# Patient Record
Sex: Female | Born: 1950 | Race: Black or African American | Hispanic: No | Marital: Married | Smoking: Former smoker
Health system: Southern US, Community
[De-identification: ages and names within clinical notes are randomized; demographics above are authoritative.]

## PROBLEM LIST (undated history)

## (undated) DIAGNOSIS — J45909 Unspecified asthma, uncomplicated: Secondary | ICD-10-CM

## (undated) DIAGNOSIS — J069 Acute upper respiratory infection, unspecified: Secondary | ICD-10-CM

## (undated) DIAGNOSIS — E232 Diabetes insipidus: Secondary | ICD-10-CM

## (undated) DIAGNOSIS — L309 Dermatitis, unspecified: Secondary | ICD-10-CM

## (undated) DIAGNOSIS — E7800 Pure hypercholesterolemia, unspecified: Secondary | ICD-10-CM

## (undated) DIAGNOSIS — I8290 Acute embolism and thrombosis of unspecified vein: Secondary | ICD-10-CM

## (undated) HISTORY — DX: Unspecified asthma, uncomplicated: J45.909

## (undated) HISTORY — DX: Diabetes insipidus: E23.2

## (undated) HISTORY — DX: Acute upper respiratory infection, unspecified: J06.9

## (undated) HISTORY — DX: Pure hypercholesterolemia, unspecified: E78.00

## (undated) HISTORY — DX: Dermatitis, unspecified: L30.9

## (undated) HISTORY — DX: Acute embolism and thrombosis of unspecified vein: I82.90

## (undated) HISTORY — PX: ANKLE SURGERY: SHX546

---

## 1988-02-09 HISTORY — PX: TOTAL ABDOMINAL HYSTERECTOMY: SHX209

## 1998-04-30 ENCOUNTER — Encounter: Payer: Self-pay | Admitting: Cardiology

## 1998-04-30 ENCOUNTER — Ambulatory Visit (HOSPITAL_COMMUNITY): Admission: RE | Admit: 1998-04-30 | Discharge: 1998-04-30 | Payer: Self-pay | Admitting: Cardiology

## 1998-12-16 ENCOUNTER — Ambulatory Visit (HOSPITAL_BASED_OUTPATIENT_CLINIC_OR_DEPARTMENT_OTHER): Admission: RE | Admit: 1998-12-16 | Discharge: 1998-12-16 | Payer: Self-pay | Admitting: Orthopedic Surgery

## 1999-02-13 ENCOUNTER — Ambulatory Visit: Admission: RE | Admit: 1999-02-13 | Discharge: 1999-02-13 | Payer: Self-pay | Admitting: Family Medicine

## 2000-11-25 ENCOUNTER — Ambulatory Visit (HOSPITAL_COMMUNITY): Admission: RE | Admit: 2000-11-25 | Discharge: 2000-11-25 | Payer: Self-pay | Admitting: Family Medicine

## 2001-01-26 ENCOUNTER — Ambulatory Visit (HOSPITAL_COMMUNITY): Admission: RE | Admit: 2001-01-26 | Discharge: 2001-01-26 | Payer: Self-pay | Admitting: Family Medicine

## 2001-12-07 ENCOUNTER — Encounter: Admission: RE | Admit: 2001-12-07 | Discharge: 2001-12-07 | Payer: Self-pay | Admitting: Family Medicine

## 2001-12-07 ENCOUNTER — Encounter: Payer: Self-pay | Admitting: Family Medicine

## 2002-10-28 ENCOUNTER — Encounter: Payer: Self-pay | Admitting: Orthopedic Surgery

## 2002-10-28 ENCOUNTER — Encounter: Admission: RE | Admit: 2002-10-28 | Discharge: 2002-10-28 | Payer: Self-pay | Admitting: Orthopedic Surgery

## 2003-03-29 ENCOUNTER — Encounter: Admission: RE | Admit: 2003-03-29 | Discharge: 2003-03-29 | Payer: Self-pay | Admitting: Family Medicine

## 2005-02-03 ENCOUNTER — Encounter: Admission: RE | Admit: 2005-02-03 | Discharge: 2005-02-03 | Payer: Self-pay | Admitting: Family Medicine

## 2005-11-05 ENCOUNTER — Encounter: Admission: RE | Admit: 2005-11-05 | Discharge: 2005-11-05 | Payer: Self-pay | Admitting: Orthopedic Surgery

## 2010-10-26 ENCOUNTER — Encounter: Payer: Self-pay | Admitting: Internal Medicine

## 2010-10-26 ENCOUNTER — Ambulatory Visit (INDEPENDENT_AMBULATORY_CARE_PROVIDER_SITE_OTHER): Payer: 59 | Admitting: Internal Medicine

## 2010-10-26 VITALS — BP 122/78 | HR 84 | Temp 97.8°F | Ht 61.0 in | Wt 150.4 lb

## 2010-10-26 DIAGNOSIS — R05 Cough: Secondary | ICD-10-CM

## 2010-10-26 NOTE — Patient Instructions (Signed)
Cough is from post viral reactive cough,  sinus drainage, possible asthma, acid reflux and all conspiring with each other  #Sinus drainage  - start netti pot daily; take picture of it - start nasal steroid daily - take samples #Possible Acid Reflux  - take acid reflux pill daily on empty stomach   - take diet sheet from [place] - avoid colas, spices, cheeses, spirits, red meats, beer, chocolates, fried foods etc.,   - sleep with head end of bed elevated  - eat small frequent meals  - do not go to bed for 3 hours after last meal #Asthma   - start QVAR 61mg 2 puff twice daily - take samples and show inhaler technique  #Cycle of cough  -  please choose 2-3 days and observe complete voice rest - no talking or whispering  - At any time there (not just the 3 days of voice rest but anytime) there is urge to cough, drink water or swallow or sip on throat lozenge   #Followup - I will see you for followup in in 5-6 weeks.

## 2010-10-26 NOTE — Progress Notes (Signed)
Subjective:    Patient ID: Madison Ball, female    DOB: 01/09/1951, 60 y.o.   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 As an adult has periodic bronchitis.  She has to talk a lot at work. C/o cough x 3 months. Insidious onset. Prior to onset of cough had bronchitis and was given antibiotics. This was followed by chest congestion and cough. After that congestion resolved but cough got worse.  Cough is dry. Quality is described as 'spasms'. Points to center of chest as source of cough.  Associated chest tightness + in "bronchial tree" and some dyspnea with exertion that is variable present. Associated feeling of mucus in throat and constant need to clear it. Up and down course; sometimes gets better and then gets worse. 10 day trial of symbicort x 3 weeks ago helped partially. States PMD noticed wheeze but she does not hear any wheeze. Overall, cough is better since onset. Cough worsened by deep inspiration and talking spells. Cough interferes with abiliuty to work   [employer] RSI score for LPR cough is 23 and c/w LPR cough - moderate hoarseness, annoying cough. Moderate-severe clearing of throat, feeling of mucus, and sensation of lump in throat. Mild heartburn and cough when lying down  PMHX -  1. has GERD but well controlled with nexium x few years but not on correct diet 2. Seasonal allergies with post nasal drip - currently active, takes daily sudafed but not nasal steroids 3. No ACE inhibitor intake 4. Asthma as childhood   Review of Systems  Constitutional: Negative for fever and unexpected weight change.  HENT: Positive for sore throat. Negative for ear pain, nosebleeds, congestion, rhinorrhea, sneezing, trouble swallowing, dental problem, postnasal drip and sinus pressure.   Eyes: Negative for redness and itching.  Respiratory: Positive for cough, chest tightness and shortness of breath. Negative for wheezing.     Cardiovascular: Positive for leg swelling. Negative for palpitations.  Gastrointestinal: Negative for nausea and vomiting.  Genitourinary: Negative for dysuria.  Musculoskeletal: Positive for joint swelling.  Skin: Negative for rash.  Neurological: Positive for headaches.  Hematological: Does not bruise/bleed easily.  Psychiatric/Behavioral: Negative for dysphoric mood. The patient is not nervous/anxious.        Objective:   Physical Exam  Vitals reviewed. Constitutional: She is oriented to person, place, and time. She appears well-developed and well-nourished. No distress.       overweight  HENT:  Head: Normocephalic and atraumatic.  Right Ear: External ear normal.  Left Ear: External ear normal.  Mouth/Throat: Oropharynx is clear and moist. No oropharyngeal exudate.  Eyes: Conjunctivae and EOM are normal. Pupils are equal, round, and reactive to light. Right eye exhibits no discharge. Left eye exhibits no discharge. No scleral icterus.  Neck: Normal range of motion. Neck supple. No JVD present. No tracheal deviation present. No thyromegaly present.  Cardiovascular: Normal rate, regular rhythm, normal heart sounds and intact distal pulses.  Exam reveals no gallop and no friction rub.   No murmur heard. Pulmonary/Chest: Effort normal and breath sounds normal. No respiratory distress. She has no wheezes. She has no rales. She exhibits no tenderness.  Abdominal: Soft. Bowel sounds are normal. She exhibits no distension and no mass. There is no tenderness. There is no rebound and no guarding.  Musculoskeletal: Normal range of motion. She exhibits no edema and no tenderness.  Lymphadenopathy:    She has no cervical adenopathy.  Neurological: She is  alert and oriented to person, place, and time. She has normal reflexes. No cranial nerve deficit. She exhibits normal muscle tone. Coordination normal.  Skin: Skin is warm and dry. No rash noted. She is not diaphoretic. No erythema. No pallor.   Psychiatric: She has a normal mood and affect. Her behavior is normal. Judgment and thought content normal.          Assessment & Plan:

## 2010-10-28 DIAGNOSIS — R05 Cough: Secondary | ICD-10-CM | POA: Insufficient documentation

## 2010-10-28 NOTE — Assessment & Plan Note (Signed)
Cough is from post viral reactive cough,  sinus drainage, possible asthma, acid reflux and all conspiring with each other to cause cycle of cough  #Sinus drainage  - start netti pot daily; take picture of it - start nasal steroid daily - take samples  #Possible Acid Reflux  - take acid reflux pill daily on empty stomach   - take diet sheet from [place] - avoid colas, spices, cheeses, spirits, red meats, beer, chocolates, fried foods etc.,   - sleep with head end of bed elevated  - eat small frequent meals  - do not go to bed for 3 hours after last meal  #Asthma   - start QVAR 8mg 2 puff twice daily - take samples and show inhaler technique  #Cycle of cough  -  please choose 2-3 days and observe complete voice rest - no talking or whispering  - At any time there (not just the 3 days of voice rest but anytime) there is urge to cough, drink water or swallow or sip on throat lozenge   #Followup - I will see you for followup in in 5-6 weeks.

## 2011-10-08 ENCOUNTER — Emergency Department (HOSPITAL_BASED_OUTPATIENT_CLINIC_OR_DEPARTMENT_OTHER): Payer: 59

## 2011-10-08 ENCOUNTER — Encounter (HOSPITAL_BASED_OUTPATIENT_CLINIC_OR_DEPARTMENT_OTHER): Payer: Self-pay | Admitting: *Deleted

## 2011-10-08 ENCOUNTER — Emergency Department (HOSPITAL_BASED_OUTPATIENT_CLINIC_OR_DEPARTMENT_OTHER)
Admission: EM | Admit: 2011-10-08 | Discharge: 2011-10-09 | Disposition: A | Discharge status: Home / Self Care | Payer: 59 | Attending: Emergency Medicine | Admitting: Emergency Medicine

## 2011-10-08 DIAGNOSIS — M549 Dorsalgia, unspecified: Secondary | ICD-10-CM | POA: Insufficient documentation

## 2011-10-08 DIAGNOSIS — M542 Cervicalgia: Secondary | ICD-10-CM | POA: Insufficient documentation

## 2011-10-08 DIAGNOSIS — Y92410 Unspecified street and highway as the place of occurrence of the external cause: Secondary | ICD-10-CM | POA: Insufficient documentation

## 2011-10-08 DIAGNOSIS — M545 Low back pain, unspecified: Secondary | ICD-10-CM | POA: Insufficient documentation

## 2011-10-08 DIAGNOSIS — E232 Diabetes insipidus: Secondary | ICD-10-CM | POA: Insufficient documentation

## 2011-10-08 DIAGNOSIS — M5020 Other cervical disc displacement, unspecified cervical region: Secondary | ICD-10-CM

## 2011-10-08 DIAGNOSIS — M25569 Pain in unspecified knee: Secondary | ICD-10-CM | POA: Insufficient documentation

## 2011-10-08 DIAGNOSIS — Z87891 Personal history of nicotine dependence: Secondary | ICD-10-CM | POA: Insufficient documentation

## 2011-10-08 DIAGNOSIS — Z79899 Other long term (current) drug therapy: Secondary | ICD-10-CM | POA: Insufficient documentation

## 2011-10-08 LAB — CBC WITH DIFFERENTIAL/PLATELET
Basophils Absolute: 0.1 10*3/uL (ref 0.0–0.1)
Eosinophils Relative: 2 % (ref 0–5)
HCT: 44.9 % (ref 36.0–46.0)
Lymphocytes Relative: 38 % (ref 12–46)
Lymphs Abs: 3.4 10*3/uL (ref 0.7–4.0)
MCV: 86.7 fL (ref 78.0–100.0)
Neutro Abs: 4.4 10*3/uL (ref 1.7–7.7)
Platelets: 260 10*3/uL (ref 150–400)
RBC: 5.18 MIL/uL — ABNORMAL HIGH (ref 3.87–5.11)
WBC: 9 10*3/uL (ref 4.0–10.5)

## 2011-10-08 LAB — BASIC METABOLIC PANEL
CO2: 29 mEq/L (ref 19–32)
Calcium: 9.7 mg/dL (ref 8.4–10.5)
Chloride: 101 mEq/L (ref 96–112)
Glucose, Bld: 103 mg/dL — ABNORMAL HIGH (ref 70–99)
Sodium: 142 mEq/L (ref 135–145)

## 2011-10-08 MED ORDER — HYDROCODONE-ACETAMINOPHEN 5-325 MG PO TABS: 1.0000 | ORAL_TABLET | Freq: Four times a day (QID) | ORAL | Status: AC | PRN

## 2011-10-08 MED ORDER — SODIUM CHLORIDE 0.9 % IV BOLUS (SEPSIS)
1000.0000 mL | Freq: Once | INTRAVENOUS | Status: AC
  Administered 2011-10-08: 1000 mL via INTRAVENOUS

## 2011-10-08 MED ORDER — POTASSIUM CHLORIDE CRYS ER 20 MEQ PO TBCR
40.0000 meq | EXTENDED_RELEASE_TABLET | Freq: Once | ORAL | Status: AC
  Administered 2011-10-08: 40 meq via ORAL

## 2011-10-08 MED ORDER — TRAMADOL HCL 50 MG PO TABS: 50.0000 mg | ORAL_TABLET | Freq: Four times a day (QID) | ORAL | Status: AC | PRN

## 2011-10-08 MED ORDER — FENTANYL CITRATE 0.05 MG/ML IJ SOLN
50.0000 ug | Freq: Once | INTRAMUSCULAR | Status: AC
  Administered 2011-10-08: 50 ug via INTRAVENOUS
  Filled 2011-10-08: qty 2

## 2011-10-08 MED ORDER — HYDROCODONE-ACETAMINOPHEN 5-325 MG PO TABS
1.0000 | ORAL_TABLET | Freq: Once | ORAL | Status: AC
  Administered 2011-10-08: 1 via ORAL
  Filled 2011-10-08: qty 1

## 2011-10-08 MED ORDER — POTASSIUM CHLORIDE CRYS ER 20 MEQ PO TBCR
EXTENDED_RELEASE_TABLET | ORAL | Status: AC
  Filled 2011-10-08: qty 2
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
## 2011-10-09 ENCOUNTER — Ambulatory Visit (HOSPITAL_BASED_OUTPATIENT_CLINIC_OR_DEPARTMENT_OTHER)
Admit: 2011-10-09 | Discharge: 2011-10-09 | Disposition: A | Discharge status: Home / Self Care | Payer: 59 | Source: Ambulatory Visit | Attending: Emergency Medicine | Admitting: Emergency Medicine

## 2011-10-09 DIAGNOSIS — M5020 Other cervical disc displacement, unspecified cervical region: Secondary | ICD-10-CM

## 2011-10-09 DIAGNOSIS — M5030 Other cervical disc degeneration, unspecified cervical region: Secondary | ICD-10-CM | POA: Insufficient documentation

## 2011-10-09 DIAGNOSIS — M542 Cervicalgia: Secondary | ICD-10-CM | POA: Insufficient documentation

## 2011-10-09 DIAGNOSIS — M5380 Other specified dorsopathies, site unspecified: Secondary | ICD-10-CM | POA: Insufficient documentation
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
## 2011-10-13 LAB — GLUCOSE, CAPILLARY: Glucose-Capillary: 113 mg/dL — ABNORMAL HIGH (ref 70–99)

## 2012-11-27 ENCOUNTER — Ambulatory Visit
Admission: RE | Admit: 2012-11-27 | Discharge: 2012-11-27 | Disposition: A | Discharge status: Home / Self Care | Payer: 59 | Source: Ambulatory Visit | Attending: Family Medicine | Admitting: Family Medicine

## 2012-11-27 ENCOUNTER — Other Ambulatory Visit: Payer: Self-pay | Admitting: Family Medicine

## 2012-11-27 DIAGNOSIS — R609 Edema, unspecified: Secondary | ICD-10-CM

## 2012-11-27 DIAGNOSIS — R52 Pain, unspecified: Secondary | ICD-10-CM

## 2014-02-19 ENCOUNTER — Ambulatory Visit
Admission: RE | Admit: 2014-02-19 | Discharge: 2014-02-19 | Disposition: A | Discharge status: Home / Self Care | Payer: Medicare Other | Source: Ambulatory Visit | Attending: Pulmonary Disease | Admitting: Pulmonary Disease

## 2014-02-19 ENCOUNTER — Other Ambulatory Visit: Payer: Self-pay | Admitting: Pulmonary Disease

## 2014-02-19 DIAGNOSIS — R0600 Dyspnea, unspecified: Secondary | ICD-10-CM

## 2014-03-09 ENCOUNTER — Emergency Department (HOSPITAL_BASED_OUTPATIENT_CLINIC_OR_DEPARTMENT_OTHER): Payer: Medicare Other

## 2014-03-09 ENCOUNTER — Encounter (HOSPITAL_BASED_OUTPATIENT_CLINIC_OR_DEPARTMENT_OTHER): Payer: Self-pay | Admitting: *Deleted

## 2014-03-09 ENCOUNTER — Emergency Department (HOSPITAL_BASED_OUTPATIENT_CLINIC_OR_DEPARTMENT_OTHER)
Admission: EM | Admit: 2014-03-09 | Discharge: 2014-03-09 | Disposition: A | Discharge status: Home / Self Care | Payer: Medicare Other | Attending: Emergency Medicine | Admitting: Emergency Medicine

## 2014-03-09 DIAGNOSIS — Y998 Other external cause status: Secondary | ICD-10-CM | POA: Insufficient documentation

## 2014-03-09 DIAGNOSIS — Z79899 Other long term (current) drug therapy: Secondary | ICD-10-CM | POA: Insufficient documentation

## 2014-03-09 DIAGNOSIS — Z8679 Personal history of other diseases of the circulatory system: Secondary | ICD-10-CM | POA: Insufficient documentation

## 2014-03-09 DIAGNOSIS — S8991XA Unspecified injury of right lower leg, initial encounter: Secondary | ICD-10-CM | POA: Diagnosis not present

## 2014-03-09 DIAGNOSIS — Z87891 Personal history of nicotine dependence: Secondary | ICD-10-CM | POA: Diagnosis not present

## 2014-03-09 DIAGNOSIS — Y9289 Other specified places as the place of occurrence of the external cause: Secondary | ICD-10-CM | POA: Insufficient documentation

## 2014-03-09 DIAGNOSIS — W000XXA Fall on same level due to ice and snow, initial encounter: Secondary | ICD-10-CM | POA: Insufficient documentation

## 2014-03-09 DIAGNOSIS — Y9389 Activity, other specified: Secondary | ICD-10-CM | POA: Insufficient documentation

## 2014-03-09 DIAGNOSIS — E232 Diabetes insipidus: Secondary | ICD-10-CM | POA: Diagnosis not present

## 2014-03-09 DIAGNOSIS — S82421A Displaced transverse fracture of shaft of right fibula, initial encounter for closed fracture: Secondary | ICD-10-CM | POA: Diagnosis not present

## 2014-03-09 DIAGNOSIS — S99911A Unspecified injury of right ankle, initial encounter: Secondary | ICD-10-CM | POA: Diagnosis present

## 2014-03-09 DIAGNOSIS — S82401A Unspecified fracture of shaft of right fibula, initial encounter for closed fracture: Secondary | ICD-10-CM

## 2014-03-09 MED ORDER — HYDROCODONE-ACETAMINOPHEN 5-325 MG PO TABS: 1.0000 | ORAL_TABLET | Freq: Four times a day (QID) | ORAL | Status: DC | PRN

## 2014-03-09 MED ORDER — ACETAMINOPHEN 500 MG PO TABS
1000.0000 mg | ORAL_TABLET | Freq: Once | ORAL | Status: AC
  Administered 2014-03-09: 1000 mg via ORAL
  Filled 2014-03-09: qty 2

## 2014-03-09 NOTE — ED Provider Notes (Signed)
CSN: [id number]     Arrival date & time 03/09/14  1506 History  This chart was scribed for Madison Ball, * by Randa Evens, ED Scribe. This patient was seen in room MH05/MH05 and the patient's care was started at 3:13 PM.     Chief Complaint  Patient presents with  . Fall   Patient is a 64 y.o. female presenting with fall. The history is provided by the patient. No language interpreter was used.  Fall This is a new problem. The current episode started yesterday. The problem occurs rarely. The problem has not changed since onset.The symptoms are aggravated by walking. Nothing relieves the symptoms. She has tried a cold compress and rest for the symptoms.   HPI Comments: Madison Ball is a 64 y.o. female who presents to the Emergency Department complaining of fall onset 1 day prior. Pt states she slipped on some ice and twisted her right ankle. Pt states she has some associated right knee pain and upper leg cramps in her right thigh. Pt also has gait problem and has been using crutches to assist with ambulating. Pt states she has applied compression wrap, ice and elevated the foot with no relief. Pt states she has taken aspirin that has only provided slight relief. Pt states she is not able to bear weight on the right foot. Pt denies head injury, LOC, neck pain, back pain.  Past Medical History  Diagnosis Date  . Diabetes insipidus   . Blood clot in vein    Past Surgical History  Procedure Laterality Date  . Total abdominal hysterectomy     Family History  Problem Relation Age of Onset  . Heart disease Father   . Breast cancer Mother    History  Substance Use Topics  . Smoking status: Former Smoker -- 0.50 packs/day for 3 years    Types: Cigarettes    Quit date: 02/08/1970  . Smokeless tobacco: Not on file  . Alcohol Use: No   OB History    No data available     Review of Systems  Musculoskeletal: Positive for myalgias, arthralgias and gait problem. Negative for  back pain and neck pain.  Neurological: Negative for syncope.  All other systems reviewed and are negative.     Allergies  Emetine; Erythromycin; and Librium  Home Medications   Prior to Admission medications   Medication Sig Start Date End Date Taking? Authorizing Provider  Cholecalciferol (VITAMIN D PO) Take 1 tablet by mouth daily. 500 IU    Yes Historical Provider, MD  esomeprazole (NEXIUM) 40 MG capsule Take 40 mg by mouth daily before breakfast.     Yes Historical Provider, MD  MAXZIDE-25 37.5-25 MG per tablet Take 1 tablet by mouth Daily. 10/19/10  Yes Historical Provider, MD  metFORMIN (GLUCOPHAGE) 500 MG tablet Take 1 tablet by mouth Daily. 10/13/10  Yes Historical Provider, MD  PREMARIN 0.45 MG tablet Take 1 tablet by mouth Daily. 10/24/10  Yes Historical Provider, MD  SINGULAIR 10 MG tablet Take 1 tablet by mouth Daily. 09/29/10  Yes Historical Provider, MD  Vitamin E 400 UNITS TABS Take 2 tablets by mouth daily.     Yes Historical Provider, MD  VYTORIN 10-40 MG per tablet Take 1 tablet by mouth Daily. 10/13/10  Yes Historical Provider, MD   BP 144/92 mmHg  Pulse 113  Temp(Src) 99.1 F (37.3 C) (Oral)  Resp 18  Ht 5' 1"  (1.549 m)  Wt 145 lb (65.772 kg)  BMI  27.41 kg/m2  SpO2 100%   Physical Exam  Constitutional: She is oriented to person, place, and time. She appears well-developed and well-nourished. No distress.  HENT:  Head: Normocephalic and atraumatic.  Eyes: Conjunctivae are normal. No scleral icterus.  Neck: Neck supple.  Cardiovascular: Normal rate and intact distal pulses.   Pulmonary/Chest: Effort normal. No stridor. No respiratory distress.  Abdominal: Normal appearance. She exhibits no distension.  Musculoskeletal:       Right knee: She exhibits normal range of motion. Tenderness found. Lateral joint line tenderness noted.       Right ankle: She exhibits ecchymosis. She exhibits normal range of motion, no swelling, no deformity and normal pulse. Tenderness.  Lateral malleolus tenderness found. Achilles tendon exhibits no pain, no defect and normal Thompson's test results.  Neurological: She is alert and oriented to person, place, and time.  Skin: Skin is warm and dry. No rash noted.  Psychiatric: She has a normal mood and affect. Her behavior is normal.  Nursing note and vitals reviewed.   ED Course  Procedures (including critical care time) DIAGNOSTIC STUDIES: Oxygen Saturation is 100% on RA, normal by my interpretation.    COORDINATION OF CARE: 3:25 PM-Discussed treatment plan with pt at bedside and pt agreed to plan.     Labs Review Labs Reviewed - No data to display  Imaging Review Dg Ankle Complete Right  03/09/2014   CLINICAL DATA:  The patient's slipped on the ice the night of 03/08/2014. Lateral right ankle pain and swelling. Initial encounter.  EXAM: RIGHT ANKLE - COMPLETE 3+ VIEW  COMPARISON:  None.  FINDINGS: The patient has a fracture of distal fibula extending from the posterior cortex of the diaphysis anteriorly and inferiorly through the metaphysis. The fracture demonstrates minimal lateral displacement and distraction. No other acute bony or joint abnormality is identified.  IMPRESSION: Acute distal right fibular fracture with associated soft tissue swelling as described.   Electronically Signed   By: Inge Rise M.D.   On: 03/09/2014 15:46   Dg Knee Complete 4 Views Right  03/09/2014   CLINICAL DATA:  Patient states she slipped on ice last night and injured her right knee. C/o anterior pain.  EXAM: RIGHT KNEE - COMPLETE 4+ VIEW  COMPARISON:  10/08/2011  FINDINGS: No fracture or dislocation. Mild lateral joint space compartment narrowing. Small marginal osteophytes from the medial lateral compartments.  No joint effusion.  Soft tissues are unremarkable.  IMPRESSION: No fracture or acute finding.   Electronically Signed   By: Lajean Manes M.D.   On: 03/09/2014 15:45  All radiology studies independently viewed by me.       EKG Interpretation None      MDM   Final diagnoses:  Right fibular fracture, closed, initial encounter      Slipped and fell last night, injuring her right ankle. Plain films pending.  Pt denies any other injuries.    Plain films show distal fibula fracture.  Plan splint, ortho follow up.     I personally performed the services described in this documentation, which was scribed in my presence. The recorded information has been reviewed and is accurate.      Madison Siren III, MD 03/09/14 [phone]

## 2014-03-09 NOTE — ED Notes (Signed)
Pt reports she slipped on ice last evening and twisted right ankle- using borrowed crutches- has been icing and elevating

## 2014-03-09 NOTE — Discharge Instructions (Signed)
Fibular Fracture, Ankle, Adult, Undisplaced, Treated With Immobilization °A simple fracture of the bone below the knee on the outside of your leg (fibula) usually heals without problems. °CAUSES °Typically, a fibular fracture occurs as a result of trauma. A blow to the side of your leg or a powerful twisting movement can cause a fracture. Fibular fractures are often seen as a result of football, soccer, or skiing injuries. °SYMPTOMS °Symptoms of a fibular fracture can include: °· Pain. °· Shortening or abnormal alignment of your lower leg (angulation). °DIAGNOSIS °A health care provider will need to examine the leg. X-ray exams will be ordered for further to confirm the fracture and evaluate the extent and of the injury. °TREATMENT  °Typically, a cast or immobilizer is applied. Sometimes a splint is placed on these fractures if it is needed for comfort or if the bones are badly out of place. Crutches may be needed to help you get around.  °HOME CARE INSTRUCTIONS  °· Apply ice to the injured area: °¨ Put ice in a plastic bag. °¨ Place a towel between your skin and the bag. °¨ Leave the ice on for 20 minutes, 2-3 times a day. °· Use crutches as directed. Resume walking without crutches as directed by your health care provider or when comfortable doing so. °· Only take over-the-counter or prescription medicines for pain, discomfort, or fever as directed by your health care provider. °· Keeping your leg raised may lessen swelling. °· If you have a removable splint or boot, do not remove the boot unless directed by your health care provider. °· Do not not drive a car or operate a motor vehicle until your health care provider specifically tells you it is safe to do so. °SEEK IMMEDIATE MEDICAL CARE IF:  °· Your cast gets damaged or breaks. °· You have continued severe pain or more swelling than you did before the cast was put on, or the pain is not controlled with medications. °· Your skin or nails below the injury turn  blue or grey, or feel cold or numb. °· There is a bad smell or pus coming from under the cast. °· You develop severe pain in ankle or foot. °MAKE SURE YOU:  °· Understand these instructions. °· Will watch your condition. °· Will get help right away if you are not doing well or get worse. °Document Released: 10/17/2001 Document Revised: 11/15/2012 Document Reviewed: 09/06/2012 °[employer]® Patient Information ©2015 [employer]. This information is not intended to replace advice given to you by your health care provider. Make sure you discuss any questions you have with your health care provider. ° °

## 2014-08-05 ENCOUNTER — Other Ambulatory Visit: Payer: Self-pay

## 2014-08-22 ENCOUNTER — Other Ambulatory Visit (HOSPITAL_COMMUNITY)
Admission: RE | Admit: 2014-08-22 | Discharge: 2014-08-22 | Disposition: A | Discharge status: Home / Self Care | Payer: Medicare Other | Source: Ambulatory Visit | Attending: Family Medicine | Admitting: Family Medicine

## 2014-08-22 DIAGNOSIS — Z1151 Encounter for screening for human papillomavirus (HPV): Secondary | ICD-10-CM | POA: Diagnosis present

## 2014-08-22 DIAGNOSIS — Z124 Encounter for screening for malignant neoplasm of cervix: Secondary | ICD-10-CM | POA: Insufficient documentation

## 2014-08-22 DIAGNOSIS — R87619 Unspecified abnormal cytological findings in specimens from cervix uteri: Secondary | ICD-10-CM | POA: Diagnosis present

## 2014-12-16 ENCOUNTER — Ambulatory Visit (INDEPENDENT_AMBULATORY_CARE_PROVIDER_SITE_OTHER): Payer: Medicare Other | Admitting: Allergy and Immunology

## 2014-12-16 ENCOUNTER — Encounter: Payer: Self-pay | Admitting: Allergy and Immunology

## 2014-12-16 VITALS — BP 118/82 | HR 88 | Temp 98.8°F | Resp 16

## 2014-12-16 DIAGNOSIS — J3089 Other allergic rhinitis: Secondary | ICD-10-CM | POA: Diagnosis not present

## 2014-12-16 DIAGNOSIS — R05 Cough: Secondary | ICD-10-CM | POA: Diagnosis not present

## 2014-12-16 DIAGNOSIS — J302 Other seasonal allergic rhinitis: Secondary | ICD-10-CM | POA: Insufficient documentation

## 2014-12-16 DIAGNOSIS — R053 Chronic cough: Secondary | ICD-10-CM

## 2014-12-16 MED ORDER — ESOMEPRAZOLE MAGNESIUM 40 MG PO CPDR: 40.0000 mg | DELAYED_RELEASE_CAPSULE | Freq: Every day | ORAL | Status: DC

## 2014-12-16 NOTE — Patient Instructions (Signed)
Chronic cough Persistent cough, likely multifactorial. Will proceed with a therapeutic trial of higher-dose proton pump inhibitor on a scheduled, rather than as needed basis over the next month.  We will not change other medications at this time so as to minimize variables.  A prescription has been provided for dexlansoprazole 30 mg bid for the next month while assessing for symptom reduction.  For now, continue Dulera 200/5 g, 2 inhalations via spacer device twice a day, montelukast 10 mg daily at bedtime, albuterol HFA as needed, fluticasone nasal spray as needed, and nasal saline lavage as needed.  Allergic rhinitis  Continue allergen avoidance, montelukast 10 mg daily, fluticasone nasal spray as needed, and nasal saline lavage as needed.   Return in about 4 weeks (around 01/13/2015), or if symptoms worsen or fail to improve.

## 2014-12-16 NOTE — Progress Notes (Signed)
History of present illness: HPI Comments: Madison Ball is a 64 y.o. female with persistent cough and allergic rhinitis who presents today for follow up.  Her cough has not improved significantly despite compliance with medications recommended during her initial visit on 11/04/2014.  She reports that her symptoms improved transiently, but did not completely resolve, while on prednisone.  The cough occurs during the day and at nighttime, but seems to be somewhat worse when recumbent.  She admits to occasional heartburn and water brash.  She takes over-the-counter Nexium on an as-needed basis.  She has no nasal symptom complaints today.    Assessment and plan: Chronic cough Persistent cough, likely multifactorial. We will proceed with a therapeutic trial of higher-dose proton pump inhibitor on a scheduled, rather than as needed, basis over the next month.  We will not change other medications at this time so as to minimize variables.  A prescription has been provided for dexlansoprazole 30 mg bid for the next month while assessing for symptom reduction.  For now, continue Dulera 200/5 g, 2 inhalations via spacer device twice a day, montelukast 10 mg daily at bedtime, albuterol HFA as needed, fluticasone nasal spray as needed, and nasal saline lavage as needed.  Allergic rhinitis  Continue allergen avoidance, montelukast 10 mg daily, fluticasone nasal spray as needed, and nasal saline lavage as needed.    Medications ordered this encounter: Meds ordered this encounter  Medications  . esomeprazole (NEXIUM) 40 MG capsule    Sig: Take 1 capsule (40 mg total) by mouth daily before breakfast.    Dispense:  30 capsule    Refill:  3    Diagnositics: Spirometry: Normal. Please see scanned spirometry results for details.    Physical examination: Blood pressure 118/82, pulse 88, temperature 98.8 F (37.1 C), temperature source Oral, resp. rate 16.  General: Alert, interactive, in no acute  distress. HEENT: TMs pearly gray, turbinates mildly edematous without discharge, post-pharynx moderately erythematous. Neck: Supple without lymphadenopathy. Lungs: Clear to auscultation without wheezing, rhonchi or rales. CV: Normal S1, S2 without murmurs. Skin: Warm and dry, without lesions or rashes.  The following portions of the patient's history were reviewed and updated as appropriate: allergies, current medications, past family history, past medical history, past social history, past surgical history and problem list.  Outpatient medications:   Medication List       This list is accurate as of: 12/16/14  2:17 PM.  Always use your most recent med list.               DULERA 200-5 MCG/ACT Aero  Generic drug:  mometasone-formoterol  Inhale 2 puffs into the lungs 2 (two) times daily.     esomeprazole 40 MG capsule  Commonly known as:  NEXIUM  Take 1 capsule (40 mg total) by mouth daily before breakfast.     fluticasone 50 MCG/ACT nasal spray  Commonly known as:  FLONASE  Place 1 spray into both nostrils daily.     HYDROcodone-acetaminophen 5-325 MG tablet  Commonly known as:  NORCO/VICODIN  Take 1-2 tablets by mouth every 6 (six) hours as needed for moderate pain or severe pain.     MAXZIDE-25 37.5-25 MG tablet  Generic drug:  triamterene-hydrochlorothiazide  Take 1 tablet by mouth Daily.     metFORMIN 500 MG tablet  Commonly known as:  GLUCOPHAGE  Take 1 tablet by mouth Daily.     PREMARIN 0.45 MG tablet  Generic drug:  estrogens (conjugated)  Take 1 tablet by  mouth Daily.     PROAIR HFA 108 (90 BASE) MCG/ACT inhaler  Generic drug:  albuterol  Inhale 2 puffs into the lungs every 4 (four) hours as needed for wheezing or shortness of breath.     SINGULAIR 10 MG tablet  Generic drug:  montelukast  Take 1 tablet by mouth Daily.     Vitamin E 400 UNITS Tabs  Take 2 tablets by mouth daily.     VYTORIN 10-40 MG tablet  Generic drug:  ezetimibe-simvastatin  Take  1 tablet by mouth Daily.        Known medication allergies: Allergies  Allergen Reactions  . Emetine   . Erythromycin   . Reglan [Metoclopramide]   . Librium [Chlordiazepoxide] Rash    I appreciate the opportunity to take part in this Madison Ball's care. Please do not hesitate to contact me with questions.  Sincerely,   R. Edgar Frisk, MD

## 2014-12-16 NOTE — Assessment & Plan Note (Addendum)
Persistent cough, likely multifactorial. We will proceed with a therapeutic trial of higher-dose proton pump inhibitor on a scheduled, rather than as needed, basis over the next month.  We will not change other medications at this time so as to minimize variables.  A prescription has been provided for dexlansoprazole 30 mg bid for the next month while assessing for symptom reduction.  For now, continue Dulera 200/5 g, 2 inhalations via spacer device twice a day, montelukast 10 mg daily at bedtime, albuterol HFA as needed, fluticasone nasal spray as needed, and nasal saline lavage as needed.

## 2014-12-16 NOTE — Assessment & Plan Note (Signed)
   Continue allergen avoidance, montelukast 10 mg daily, fluticasone nasal spray as needed, and nasal saline lavage as needed.

## 2015-01-20 ENCOUNTER — Encounter: Payer: Self-pay | Admitting: Allergy and Immunology

## 2015-01-20 ENCOUNTER — Ambulatory Visit (INDEPENDENT_AMBULATORY_CARE_PROVIDER_SITE_OTHER): Payer: Medicare Other | Admitting: Allergy and Immunology

## 2015-01-20 VITALS — BP 108/60 | HR 86 | Temp 98.1°F | Resp 16 | Ht 61.5 in | Wt 145.0 lb

## 2015-01-20 DIAGNOSIS — R059 Cough, unspecified: Secondary | ICD-10-CM

## 2015-01-20 DIAGNOSIS — R05 Cough: Secondary | ICD-10-CM

## 2015-01-20 DIAGNOSIS — J3089 Other allergic rhinitis: Secondary | ICD-10-CM | POA: Diagnosis not present

## 2015-01-20 MED ORDER — ESOMEPRAZOLE MAGNESIUM 20 MG PO CPDR: 20.0000 mg | DELAYED_RELEASE_CAPSULE | Freq: Every day | ORAL | Status: DC

## 2015-01-20 NOTE — Assessment & Plan Note (Signed)
Stable.  Continue allergen avoidance, montelukast 10 mg daily, fluticasone nasal spray as needed, and nasal saline lavage as needed.

## 2015-01-20 NOTE — Patient Instructions (Addendum)
Coughing Improved on scheduled higher dose PPI. We will step down therapy and assess for symptom change.  Decrease esomeprazole from 40 mg to 20 mg daily.   If symptoms recur or progress in frequency and/or severity, the patient is to resume the previous dose. A prescriptoin has been provided.  For now, continue Dulera 200/5 g, 2 inhalations via spacer device twice a day, and albuterol every 4-6 hours as needed.  Allergic rhinitis Stable.  Continue allergen avoidance, montelukast 10 mg daily, fluticasone nasal spray as needed, and nasal saline lavage as needed.    Return in about 6 months (around 07/21/2015), or if symptoms worsen or fail to improve.

## 2015-01-20 NOTE — Assessment & Plan Note (Addendum)
Improved on scheduled higher dose PPI. We will step down therapy and assess for symptom change.  Decrease esomeprazole from 40 mg to 20 mg daily.   If symptoms recur or progress in frequency and/or severity, the patient is to resume the previous dose. A prescriptoin has been provided.  For now, continue Dulera 200/5 g, 2 inhalations via spacer device twice a day, and albuterol every 4-6 hours as needed.

## 2015-01-20 NOTE — Progress Notes (Signed)
RE: Madison Ball MRN: [id number] DOB: 09/03/1950 [facility] [address] [place] [address] Date of Office Visit: 01/20/2015  Referring provider: No referring provider defined for this encounter.  Chief Complaint: Cough   History of present illness: HPI Comments: Madison Ball is a 64 y.o. female who presents today for follow up. She was last seen in this office on November 7 for a persistent cough.  At that time, her proton pump inhibitor dose was increased and she was advised to take this medication on a scheduled rather than as needed basis.  She reports that her cough has improved over this last month while taking Nexium 40 mg daily.  She has no nasal symptom complaints today.   Assessment and plan: Coughing Improved on scheduled higher dose PPI. We will step down therapy and assess for symptom change.  Decrease esomeprazole from 40 mg to 20 mg daily.   If symptoms recur or progress in frequency and/or severity, the patient is to resume the previous dose. A prescriptoin has been provided.  For now, continue Dulera 200/5 g, 2 inhalations via spacer device twice a day, and albuterol every 4-6 hours as needed.  Allergic rhinitis Stable.  Continue allergen avoidance, montelukast 10 mg daily, fluticasone nasal spray as needed, and nasal saline lavage as needed.    Medications ordered this encounter: Meds ordered this encounter  Medications  . esomeprazole (NEXIUM) 20 MG capsule    Sig: Take 1 capsule (20 mg total) by mouth daily.    Dispense:  30 capsule    Refill:  5    Diagnositics: Spirometry:  Normal.  Please see scanned spirometry results for details.    Physical examination: Blood pressure 108/60, pulse 86, temperature 98.1 F (36.7 C), temperature source Oral, resp. rate 16, height 5' 1.5" (1.562 m), weight 145 lb (65.772 kg).  General: Alert, interactive, in no acute distress. HEENT:  TMs pearly gray, turbinates mildly edematous without discharge, post-pharynx mildly erythematous. Neck: Supple without lymphadenopathy. Lungs: Clear to auscultation without wheezing, rhonchi or rales. CV: Normal S1, S2 without murmurs. Skin: Warm and dry, without lesions or rashes.  The following portions of the patient's history were reviewed and updated as appropriate: allergies, current medications, past family history, past medical history, past social history, past surgical history and problem list.  Outpatient medications:   Medication List       This list is accurate as of: 01/20/15 12:55 PM.  Always use your most recent med list.               DULERA 200-5 MCG/ACT Aero  Generic drug:  mometasone-formoterol  Inhale 2 puffs into the lungs 2 (two) times daily.     esomeprazole 20 MG capsule  Commonly known as:  NEXIUM  Take 1 capsule (20 mg total) by mouth daily.     fluticasone 50 MCG/ACT nasal spray  Commonly known as:  FLONASE  Place 1 spray into both nostrils daily.     MAXZIDE-25 37.5-25 MG tablet  Generic drug:  triamterene-hydrochlorothiazide  Take 1 tablet by mouth Daily.     metFORMIN 500 MG tablet  Commonly known as:  GLUCOPHAGE  Take 1 tablet by mouth Daily.     PREMARIN 0.45 MG tablet  Generic drug:  estrogens (conjugated)  Take 1 tablet by mouth Daily.     PROAIR HFA 108 (90 BASE) MCG/ACT inhaler  Generic drug:  albuterol  Inhale  2 puffs into the lungs every 4 (four) hours as needed for wheezing or shortness of breath.     SINGULAIR 10 MG tablet  Generic drug:  montelukast  Take 1 tablet by mouth Daily.     Vitamin E 400 UNITS Tabs  Take 2 tablets by mouth daily.     VYTORIN 10-40 MG tablet  Generic drug:  ezetimibe-simvastatin  Take 1 tablet by mouth Daily.        Known medication allergies: Allergies  Allergen Reactions  . Emetine   . Erythromycin   . Reglan [Metoclopramide]   . Librium [Chlordiazepoxide] Rash    I appreciate  the opportunity to take part in this Sharetha's care. Please do not hesitate to contact me with questions.  Sincerely,   R. Edgar Frisk, MD

## 2015-02-13 ENCOUNTER — Other Ambulatory Visit: Payer: Self-pay | Admitting: Allergy

## 2015-02-13 NOTE — Telephone Encounter (Deleted)
DULERA

## 2015-02-13 NOTE — Telephone Encounter (Signed)
DULERA 200 MCG/5MCG INHALER APPROVED.LEFT MESSAGE FOR PATIENT.

## 2015-02-13 NOTE — Telephone Encounter (Signed)
[place].INFORMED PATIENT.

## 2015-07-28 ENCOUNTER — Encounter: Payer: Self-pay | Admitting: Pediatrics

## 2015-07-28 ENCOUNTER — Ambulatory Visit (INDEPENDENT_AMBULATORY_CARE_PROVIDER_SITE_OTHER): Payer: Medicare Other | Admitting: Pediatrics

## 2015-07-28 VITALS — BP 100/60 | HR 86 | Temp 97.8°F | Resp 16

## 2015-07-28 DIAGNOSIS — K219 Gastro-esophageal reflux disease without esophagitis: Secondary | ICD-10-CM | POA: Diagnosis not present

## 2015-07-28 DIAGNOSIS — J301 Allergic rhinitis due to pollen: Secondary | ICD-10-CM | POA: Diagnosis not present

## 2015-07-28 DIAGNOSIS — J4540 Moderate persistent asthma, uncomplicated: Secondary | ICD-10-CM | POA: Diagnosis not present

## 2015-07-28 LAB — PULMONARY FUNCTION TEST

## 2015-07-28 MED ORDER — FLUTICASONE PROPIONATE 50 MCG/ACT NA SUSP: 2.0000 | Freq: Every day | NASAL | Status: DC

## 2015-07-28 MED ORDER — MOMETASONE FURO-FORMOTEROL FUM 200-5 MCG/ACT IN AERO: 2.0000 | INHALATION_SPRAY | Freq: Two times a day (BID) | RESPIRATORY_TRACT | Status: DC

## 2015-07-28 NOTE — Progress Notes (Signed)
  [place] [id number] Dept: [phone]  FOLLOW UP NOTE  Patient ID: Madison Ball, female    DOB: November 11, 1950  Age: 65 y.o. MRN: [id number] Date of Office Visit: 07/28/2015  Assessment Chief Complaint: Follow-up  HPI Madison Ball presents for follow-up of asthma and allergic rhinitis. She is allergic to tree pollens, dust mites, cat and mouse. She had an exacerbation of asthma in March of this year and was treated with prednisone for a few days.. She has improved since then.  Current medications are Pro-air 2 puffs every 4 hours if needed, Dulera 200-5 - 2 puffs once a day, fluticasone 2 sprays per nostril once a day, montelukast 10 mg once a day., Nexium 20 mg daily. Her other medications are outlined in the chart.   Drug Allergies:  Allergies  Allergen Reactions  . Emetine   . Erythromycin   . Reglan [Metoclopramide]   . Chlordiazepoxide Hcl Rash  . Librium [Chlordiazepoxide] Rash    Physical Exam: BP 100/60 mmHg  Pulse 86  Temp(Src) 97.8 F (36.6 C) (Oral)  Resp 16   Physical Exam  Constitutional: She is oriented to person, place, and time. She appears well-developed and well-nourished.  HENT:  Eyes normal. Ears normal. Nose normal. Pharynx normal.  Neck: Neck supple.  Cardiovascular:  S1 and S2 normal no murmurs  Pulmonary/Chest:  Clear to percussion and auscultation  Lymphadenopathy:    She has no cervical adenopathy.  Neurological: She is alert and oriented to person, place, and time.  Psychiatric: She has a normal mood and affect. Her behavior is normal. Judgment and thought content normal.  Vitals reviewed.   Diagnostics:  FVC 2.01 L FEV1 1.87 L. Predicted FVC 2.22 L predicted FEV1 1.72 L-the spirometry is in the normal range  Assessment and Plan: 1. Moderate persistent asthma, uncomplicated   2. Allergic rhinitis due to pollen   3. Gastroesophageal reflux disease without esophagitis     Meds ordered this encounter    Medications  . fluticasone (FLONASE) 50 MCG/ACT nasal spray    Sig: Place 2 sprays into both nostrils daily.    Dispense:  16 g    Refill:  5    For stuffy nose  . mometasone-formoterol (DULERA) 200-5 MCG/ACT AERO    Sig: Inhale 2 puffs into the lungs 2 (two) times daily.    Dispense:  1 Inhaler    Refill:  5    For coughing or wheezing    Patient Instructions  Zyrtec 10 mg once a day if needed for runny nose Pro-air 2 puffs every 4 hours if needed for wheezing or coughing spells Fluticasone 2 sprays per nostril once a day for stuffy nose Montelukast 10 mg once a day for coughing or wheezing Dulera 200-5 -2 puffs every 12 hours for coughing or wheezing but you may use 2 puffs once a day if your  asthma is under good control Call us if you're not doing well on this treatment plan    Return in about 6 months (around 01/27/2016).    Thank you for the opportunity to care for this patient.  Please do not hesitate to contact me with questions.  Penne Lash, M.D.  [facility] [address] [place], [place] [address] [phone]

## 2015-07-28 NOTE — Patient Instructions (Addendum)
Zyrtec 10 mg once a day if needed for runny nose Pro-air 2 puffs every 4 hours if needed for wheezing or coughing spells Fluticasone 2 sprays per nostril once a day for stuffy nose Montelukast 10 mg once a day for coughing or wheezing Dulera 200-5 -2 puffs every 12 hours for coughing or wheezing but you may use 2 puffs once a day if your  asthma is under good control Call us if you're not doing well on this treatment plan

## 2016-01-19 ENCOUNTER — Ambulatory Visit (INDEPENDENT_AMBULATORY_CARE_PROVIDER_SITE_OTHER): Payer: Medicare Other | Admitting: Allergy and Immunology

## 2016-01-19 ENCOUNTER — Encounter: Payer: Self-pay | Admitting: Allergy and Immunology

## 2016-01-19 VITALS — BP 118/76 | HR 88 | Temp 98.2°F | Resp 20 | Ht 59.75 in | Wt 147.0 lb

## 2016-01-19 DIAGNOSIS — J4540 Moderate persistent asthma, uncomplicated: Secondary | ICD-10-CM

## 2016-01-19 DIAGNOSIS — K219 Gastro-esophageal reflux disease without esophagitis: Secondary | ICD-10-CM

## 2016-01-19 DIAGNOSIS — R05 Cough: Secondary | ICD-10-CM | POA: Diagnosis not present

## 2016-01-19 DIAGNOSIS — R059 Cough, unspecified: Secondary | ICD-10-CM

## 2016-01-19 DIAGNOSIS — J3089 Other allergic rhinitis: Secondary | ICD-10-CM | POA: Diagnosis not present

## 2016-01-19 DIAGNOSIS — R053 Chronic cough: Secondary | ICD-10-CM

## 2016-01-19 MED ORDER — RANITIDINE HCL 300 MG PO CAPS: 300.0000 mg | ORAL_CAPSULE | Freq: Every evening | ORAL | 5 refills | Status: DC

## 2016-01-19 MED ORDER — FLUTICASONE PROPIONATE 50 MCG/ACT NA SUSP: 2.0000 | Freq: Every day | NASAL | 5 refills | Status: DC

## 2016-01-19 MED ORDER — MOMETASONE FURO-FORMOTEROL FUM 200-5 MCG/ACT IN AERO: 2.0000 | INHALATION_SPRAY | Freq: Two times a day (BID) | RESPIRATORY_TRACT | 5 refills | Status: DC

## 2016-01-19 MED ORDER — ESOMEPRAZOLE MAGNESIUM 20 MG PO CPDR: 20.0000 mg | DELAYED_RELEASE_CAPSULE | Freq: Every day | ORAL | 5 refills | Status: DC

## 2016-01-19 NOTE — Assessment & Plan Note (Signed)
   Continue fluticasone nasal spray, 2 sprays per nostril daily.  Nasal saline lavage (NeilMed) has been recommended prior to medicated nasal sprays and as needed along with instructions for proper administration.  For thick post nasal drainage, add guaifenesin [phone] mg (Mucinex)  twice daily as needed with adequate hydration as discussed.

## 2016-01-19 NOTE — Patient Instructions (Addendum)
Cough, persistent Multifactorial.  The primary contributors are most likely acid reflux and postnasal drainage.  Treatment plan as outlined below.  Allergic rhinitis  Continue fluticasone nasal spray, 2 sprays per nostril daily.  Nasal saline lavage (NeilMed) has been recommended prior to medicated nasal sprays and as needed along with instructions for proper administration.  For thick post nasal drainage, add guaifenesin [phone] mg (Mucinex)  twice daily as needed with adequate hydration as discussed.  Gastroesophageal reflux disease without esophagitis  Appropriate reflux was still medications have been discussed and provided in written form.  Continue Nexium 20 mg daily, 30 minutes prior to breakfast.  Add ranitidine 300 mg daily at bedtime.  Moderate persistent asthma  If the cough improves significantly with the changes suggested above, we will plan to stepdown asthma therapy on her next visit and assess symptom change.  For now, continue Dulera 200-5 g, 2 inhalations via spacer device twice a day, montelukast 10 mg daily at bedtime, and albuterol HFA, 1-2 inhalations every 4-6 hours as needed.   Return in about 2 months (around 03/21/2016), or if symptoms worsen or fail to improve.  Lifestyle Changes for Controlling GERD  When you have GERD, stomach acid feels as if it's backing up toward your mouth. Whether or not you take medication to control your GERD, your symptoms can often be improved with lifestyle changes.   Raise Your Head  Reflux is more likely to strike when you're lying down flat, because stomach fluid can  flow backward more easily. Raising the head of your bed 4-6 inches can help. To do this:  Slide blocks or books under the legs at the head of your bed. Or, place a wedge under  the mattress. Many foam stores can make a suitable wedge for you. The wedge  should run from your waist to the top of your head.  Don't just prop your head on several  pillows. This increases pressure on your  stomach. It can make GERD worse.  Watch Your Eating Habits Certain foods may increase the acid in your stomach or relax the lower esophageal sphincter, making GERD more likely. It's best to avoid the following:  Coffee, tea, and carbonated drinks (with and without caffeine)  Fatty, fried, or spicy food  Mint, chocolate, onions, and tomatoes  Any other foods that seem to irritate your stomach or cause you pain  Relieve the Pressure  Eat smaller meals, even if you have to eat more often.  Don't lie down right after you eat. Wait a few hours for your stomach to empty.  Avoid tight belts and tight-fitting clothes.  Lose excess weight.  Tobacco and Alcohol  Avoid smoking tobacco and drinking alcohol. They can make GERD symptoms worse.

## 2016-01-19 NOTE — Assessment & Plan Note (Signed)
   Appropriate reflux was still medications have been discussed and provided in written form.  Continue Nexium 20 mg daily, 30 minutes prior to breakfast.  Add ranitidine 300 mg daily at bedtime.

## 2016-01-19 NOTE — Assessment & Plan Note (Addendum)
Multifactorial.  The primary contributors are most likely acid reflux and postnasal drainage.  Treatment plan as outlined below.

## 2016-01-19 NOTE — Assessment & Plan Note (Signed)
   If the cough improves significantly with the changes suggested above, we will plan to stepdown asthma therapy on her next visit and assess symptom change.  For now, continue Dulera 200-5 g, 2 inhalations via spacer device twice a day, montelukast 10 mg daily at bedtime, and albuterol HFA, 1-2 inhalations every 4-6 hours as needed.

## 2016-01-19 NOTE — Progress Notes (Signed)
Follow-up Note  RE: Madison Ball MRN: [id number] DOB: 1951-01-25 Date of Office Visit: 01/19/2016  Primary care provider: Elyn Peers, MD Referring provider: No ref. provider found  History of present illness: Madison Ball is a 65 y.o. female with history of persistent cough presenting today for follow up.  She was last seen in this clinic in December 2016.  She reports that over this pressure she has had 4 prolonged episodes of coughing and hoarseness.  She is currently experiencing one of these episodes which started 2 or 3 weeks ago.  Apparently, between these episodes the cough improves by never completely resolves.  The cough is described as a tickle originating in the base of her throat.  When I last saw her one year ago we had decreased Nexium from 40 mg to 20 mg after the coughing had improved.  In addition to Nexium, she is currently taking Dulera 200/5 g, 2 inhalations via spacer device twice a day, montelukast 10 mg daily bedtime, and fluticasone nasal spray as needed.  She denies dyspnea, chest tightness, or wheezing.  She admits to noticing thick postnasal drainage.   Assessment and plan: Cough, persistent Multifactorial.  The primary contributors are most likely acid reflux and postnasal drainage.  Treatment plan as outlined below.  Allergic rhinitis  Continue fluticasone nasal spray, 2 sprays per nostril daily.  Nasal saline lavage (NeilMed) has been recommended prior to medicated nasal sprays and as needed along with instructions for proper administration.  For thick post nasal drainage, add guaifenesin [phone] mg (Mucinex)  twice daily as needed with adequate hydration as discussed.  Gastroesophageal reflux disease without esophagitis  Appropriate reflux was still medications have been discussed and provided in written form.  Continue Nexium 20 mg daily, 30 minutes prior to breakfast.  Add ranitidine 300 mg daily at bedtime.  Moderate persistent  asthma  If the cough improves significantly with the changes suggested above, we will plan to stepdown asthma therapy on her next visit and assess symptom change.  For now, continue Dulera 200-5 g, 2 inhalations via spacer device twice a day, montelukast 10 mg daily at bedtime, and albuterol HFA, 1-2 inhalations every 4-6 hours as needed.   Meds ordered this encounter  Medications  . esomeprazole (NEXIUM) 20 MG capsule    Sig: Take 1 capsule (20 mg total) by mouth daily.    Dispense:  30 capsule    Refill:  5  . ranitidine (ZANTAC) 300 MG capsule    Sig: Take 1 capsule (300 mg total) by mouth every evening.    Dispense:  30 capsule    Refill:  5  . fluticasone (FLONASE) 50 MCG/ACT nasal spray    Sig: Place 2 sprays into both nostrils daily.    Dispense:  16 g    Refill:  5    For stuffy nose  . mometasone-formoterol (DULERA) 200-5 MCG/ACT AERO    Sig: Inhale 2 puffs into the lungs 2 (two) times daily.    Dispense:  1 Inhaler    Refill:  5    Diagnostics: Spirometry:  Normal with an FEV1 of 103% predicted.  Please see scanned spirometry results for details.    Physical examination: Blood pressure 118/76, pulse 88, temperature 98.2 F (36.8 C), temperature source Oral, resp. rate 20, height 4' 11.75" (1.518 m), weight 147 lb (66.7 kg).  General: Alert, interactive, in no acute distress. HEENT: TMs pearly gray, turbinates moderately edematous with thick discharge, post-pharynx erythematous. Neck: Supple  without lymphadenopathy. Lungs: Clear to auscultation without wheezing, rhonchi or rales. CV: Normal S1, S2 without murmurs. Skin: Warm and dry, without lesions or rashes.  The following portions of the patient's history were reviewed and updated as appropriate: allergies, current medications, past family history, past medical history, past social history, past surgical history and problem list.    Medication List       Accurate as of 01/19/16  1:22 PM. Always use your  most recent med list.          esomeprazole 20 MG capsule Commonly known as:  NEXIUM Take 1 capsule (20 mg total) by mouth daily.   fluticasone 50 MCG/ACT nasal spray Commonly known as:  FLONASE Place 2 sprays into both nostrils daily.   MAXZIDE-25 37.5-25 MG tablet Generic drug:  triamterene-hydrochlorothiazide Take 1 tablet by mouth Daily.   metFORMIN 500 MG tablet Commonly known as:  GLUCOPHAGE Take 1 tablet by mouth Daily.   mometasone-formoterol 200-5 MCG/ACT Aero Commonly known as:  DULERA Inhale 2 puffs into the lungs 2 (two) times daily.   mometasone-formoterol 200-5 MCG/ACT Aero Commonly known as:  DULERA Inhale 2 puffs into the lungs 2 (two) times daily.   PROAIR HFA 108 (90 Base) MCG/ACT inhaler Generic drug:  albuterol Inhale 2 puffs into the lungs every 4 (four) hours as needed for wheezing or shortness of breath.   ranitidine 300 MG capsule Commonly known as:  ZANTAC Take 1 capsule (300 mg total) by mouth every evening.   SINGULAIR 10 MG tablet Generic drug:  montelukast Take 1 tablet by mouth Daily.   Vitamin E 400 units Tabs Take 2 tablets by mouth daily.   VYTORIN 10-40 MG tablet Generic drug:  ezetimibe-simvastatin Take 1 tablet by mouth Daily.       Allergies  Allergen Reactions  . Emetine   . Erythromycin   . Reglan [Metoclopramide]   . Chlordiazepoxide Hcl Rash  . Librium [Chlordiazepoxide] Rash   Review of systems: Review of systems negative except as noted in HPI / PMHx or noted below: Constitutional: Negative.  HENT: Negative.   Eyes: Negative.  Respiratory: Negative.   Cardiovascular: Negative.  Gastrointestinal: Negative.  Genitourinary: Negative.  Musculoskeletal: Negative.  Neurological: Negative.  Endo/Heme/Allergies: Negative.  Cutaneous: Negative.  Past Medical History:  Diagnosis Date  . Asthma   . Blood clot in vein   . Diabetes insipidus ([place])   . Hypercholesterolemia     Family History  Problem  Relation Age of Onset  . Heart disease Father   . Breast cancer Mother   . Asthma Mother   . Bronchitis Mother     Social History   Social History  . Marital status: Married    Spouse name: N/A  . Number of children: N/A  . Years of education: N/A   Occupational History  . dentist    Social History Main Topics  . Smoking status: Former Smoker    Packs/day: 0.50    Years: 3.00    Types: Cigarettes    Quit date: 02/08/1970  . Smokeless tobacco: Not on file  . Alcohol use No  . Drug use: No  . Sexual activity: Not on file   Other Topics Concern  . Not on file   Social History Narrative  . No narrative on file    I appreciate the opportunity to take part in Arzella's care. Please do not hesitate to contact me with questions.  Sincerely,   R. Edgar Frisk, MD

## 2016-01-26 ENCOUNTER — Ambulatory Visit: Payer: Medicare Other | Admitting: Allergy and Immunology

## 2016-03-29 ENCOUNTER — Ambulatory Visit: Payer: Medicare Other | Admitting: Allergy and Immunology

## 2016-08-28 ENCOUNTER — Other Ambulatory Visit: Payer: Self-pay | Admitting: Pediatrics

## 2016-09-11 ENCOUNTER — Other Ambulatory Visit: Payer: Self-pay | Admitting: Pediatrics

## 2016-10-12 ENCOUNTER — Other Ambulatory Visit: Payer: Self-pay | Admitting: Allergy and Immunology

## 2016-10-12 ENCOUNTER — Other Ambulatory Visit: Payer: Self-pay | Admitting: Pediatrics

## 2016-11-01 ENCOUNTER — Ambulatory Visit (INDEPENDENT_AMBULATORY_CARE_PROVIDER_SITE_OTHER): Payer: Medicare Other | Admitting: Allergy and Immunology

## 2016-11-01 ENCOUNTER — Encounter: Payer: Self-pay | Admitting: Allergy and Immunology

## 2016-11-01 VITALS — BP 114/70 | HR 84 | Resp 20

## 2016-11-01 DIAGNOSIS — R05 Cough: Secondary | ICD-10-CM

## 2016-11-01 DIAGNOSIS — J3089 Other allergic rhinitis: Secondary | ICD-10-CM

## 2016-11-01 DIAGNOSIS — K219 Gastro-esophageal reflux disease without esophagitis: Secondary | ICD-10-CM | POA: Diagnosis not present

## 2016-11-01 DIAGNOSIS — J4540 Moderate persistent asthma, uncomplicated: Secondary | ICD-10-CM

## 2016-11-01 DIAGNOSIS — R053 Chronic cough: Secondary | ICD-10-CM

## 2016-11-01 MED ORDER — MOMETASONE FURO-FORMOTEROL FUM 200-5 MCG/ACT IN AERO: 2.0000 | INHALATION_SPRAY | Freq: Two times a day (BID) | RESPIRATORY_TRACT | 5 refills | Status: DC

## 2016-11-01 MED ORDER — RANITIDINE HCL 300 MG PO CAPS: 300.0000 mg | ORAL_CAPSULE | Freq: Every evening | ORAL | 5 refills | Status: DC

## 2016-11-01 MED ORDER — FLUTICASONE PROPIONATE 50 MCG/ACT NA SUSP: 2.0000 | Freq: Every day | NASAL | 5 refills | Status: DC

## 2016-11-01 MED ORDER — MONTELUKAST SODIUM 10 MG PO TABS: 10.0000 mg | ORAL_TABLET | Freq: Every day | ORAL | 3 refills | Status: DC

## 2016-11-01 NOTE — Assessment & Plan Note (Signed)
Multifactorial.    Treatment plan as outlined above.

## 2016-11-01 NOTE — Patient Instructions (Addendum)
Moderate persistent asthma  Increase Dulera 200-5 g from 2 inhalations via spacer device once a day to twice a day.  Continue montelukast 10 mg daily bedtime and albuterol HFA, 1-2 inhalations every 4-6 hours as needed.  Subjective and objective measures of pulmonary function will be followed and the treatment plan will be adjusted accordingly.  Allergic rhinitis  Continue fluticasone nasal spray, 2 sprays per nostril daily.  Nasal saline lavage (NeilMed) has been recommended prior to medicated nasal sprays and as needed along with instructions for proper administration.  For thick post nasal drainage, add guaifenesin [phone] (Mucinex)  twice daily as needed with adequate hydration as discussed.  Gastroesophageal reflux disease without esophagitis  Continue appropriate reflux lifestyle modifications and Nexium as prescribed.  I have recommended ranitidine 300 mg scheduled daily rather than as needed.  Cough, persistent Multifactorial.    Treatment plan as outlined above.   Return in about 5 months (around 04/03/2017), or if symptoms worsen or fail to improve.

## 2016-11-01 NOTE — Assessment & Plan Note (Signed)
   Continue fluticasone nasal spray, 2 sprays per nostril daily.  Nasal saline lavage (NeilMed) has been recommended prior to medicated nasal sprays and as needed along with instructions for proper administration.  For thick post nasal drainage, add guaifenesin [phone] mg (Mucinex)  twice daily as needed with adequate hydration as discussed.

## 2016-11-01 NOTE — Assessment & Plan Note (Addendum)
   Continue appropriate reflux lifestyle modifications and Nexium as prescribed.  I have recommended ranitidine 300 mg scheduled daily rather than as needed.

## 2016-11-01 NOTE — Assessment & Plan Note (Signed)
   Increase Dulera 200-5 g from 2 inhalations via spacer device once a day to twice a day.  Continue montelukast 10 mg daily bedtime and albuterol HFA, 1-2 inhalations every 4-6 hours as needed.  Subjective and objective measures of pulmonary function will be followed and the treatment plan will be adjusted accordingly.

## 2016-11-01 NOTE — Progress Notes (Signed)
Follow-up Note  RE: Madison Ball MRN: [id number] DOB: 04-08-1950 Date of Office Visit: 11/01/2016  Primary care provider: Lucianne Lei, MD Referring provider: Lucianne Lei, MD  History of present illness: Madison Ball is a 66 y.o. female with persistent asthma, allergic rhinitis, gastroesophageal reflux disease, and history of persistent cough presenting today for follow up.  She was last seen in this clinic in December 2017.  She reports that she recently had a productive cough, particularly in the morning requiring Tussionex.  However, over the past several days the cough has improved and she is currently able to control the cough with hard candy.  She has not been taking Mucinex.  She reports that she rarely requires Zantac to control heartburn, however admits that she frequently awakens in the morning with water brash.  She is currently taking Dulera 200-5 g, 2 inhalations via spacer device once daily in the morning.  She reports that recently her "bronchial tubes are flaring up again."  She believes that these lower respiratory symptoms are being triggered by hot flashes.  She reports that her hot flashes had been controlled by Premarin, however she stopped this medication at age 72 due to lack of insurance coverage and the risk of breast cancer.     Assessment and plan: Moderate persistent asthma  Increase Dulera 200-5 g from 2 inhalations via spacer device once a day to twice a day.  Continue montelukast 10 mg daily bedtime and albuterol HFA, 1-2 inhalations every 4-6 hours as needed.  Subjective and objective measures of pulmonary function will be followed and the treatment plan will be adjusted accordingly.  Allergic rhinitis  Continue fluticasone nasal spray, 2 sprays per nostril daily.  Nasal saline lavage (NeilMed) has been recommended prior to medicated nasal sprays and as needed along with instructions for proper administration.  For thick post nasal drainage, add  guaifenesin [phone] mg (Mucinex)  twice daily as needed with adequate hydration as discussed.  Gastroesophageal reflux disease without esophagitis  Continue appropriate reflux lifestyle modifications and Nexium as prescribed.  I have recommended ranitidine 300 mg scheduled daily rather than as needed.  Cough, persistent Multifactorial.    Treatment plan as outlined above.   Meds ordered this encounter  Medications  . montelukast (SINGULAIR) 10 MG tablet    Sig: Take 1 tablet (10 mg total) by mouth at bedtime.    Dispense:  30 tablet    Refill:  3  . ranitidine (ZANTAC) 300 MG capsule    Sig: Take 1 capsule (300 mg total) by mouth every evening.    Dispense:  30 capsule    Refill:  5  . mometasone-formoterol (DULERA) 200-5 MCG/ACT AERO    Sig: Inhale 2 puffs into the lungs 2 (two) times daily.    Dispense:  1 Inhaler    Refill:  5  . fluticasone (FLONASE) 50 MCG/ACT nasal spray    Sig: Place 2 sprays into both nostrils daily.    Dispense:  16 g    Refill:  5    Diagnostics: Spirometry:  Normal with an FEV1 of 112% predicted.  This study was performed while the patient was asymptomatic.  Please see scanned spirometry results for details.     Physical examination: Blood pressure 114/70, pulse 84, resp. rate 20.  General: Alert, interactive, in no acute distress. HEENT: TMs pearly gray, turbinates mildly edematous without discharge, post-pharynx moderately erythematous. Neck: Supple without lymphadenopathy. Lungs: Clear to auscultation without wheezing, rhonchi or rales. CV: Normal  S1, S2 without murmurs. Skin: Warm and dry, without lesions or rashes.  The following portions of the patient's history were reviewed and updated as appropriate: allergies, current medications, past family history, past medical history, past social history, past surgical history and problem list.  Allergies as of 11/01/2016      Reactions   Emetine    Erythromycin    Reglan  [metoclopramide]    Chlordiazepoxide Hcl Rash   Librium [chlordiazepoxide] Rash      Medication List       Accurate as of 11/01/16  6:47 PM. Always use your most recent med list.          esomeprazole 20 MG capsule Commonly known as:  NEXIUM Take 1 capsule (20 mg total) by mouth daily.   fluticasone 50 MCG/ACT nasal spray Commonly known as:  FLONASE Place 2 sprays into both nostrils daily.   MAXZIDE-25 37.5-25 MG tablet Generic drug:  triamterene-hydrochlorothiazide Take 1 tablet by mouth Daily.   metFORMIN 500 MG tablet Commonly known as:  GLUCOPHAGE Take 1 tablet by mouth Daily.   mometasone-formoterol 200-5 MCG/ACT Aero Commonly known as:  DULERA Inhale 2 puffs into the lungs 2 (two) times daily.   montelukast 10 MG tablet Commonly known as:  SINGULAIR Take 1 tablet (10 mg total) by mouth at bedtime.   PROAIR HFA 108 (90 Base) MCG/ACT inhaler Generic drug:  albuterol Inhale 2 puffs into the lungs every 4 (four) hours as needed for wheezing or shortness of breath.   ranitidine 300 MG capsule Commonly known as:  ZANTAC Take 1 capsule (300 mg total) by mouth every evening.   Vitamin E 400 units Tabs Take 2 tablets by mouth daily.   VYTORIN 10-40 MG tablet Generic drug:  ezetimibe-simvastatin Take 1 tablet by mouth Daily.            Discharge Care Instructions        Start     Ordered   11/01/16 0000  montelukast (SINGULAIR) 10 MG tablet  Daily at bedtime     11/01/16 1247   11/01/16 0000  ranitidine (ZANTAC) 300 MG capsule  Every evening     11/01/16 1247   11/01/16 0000  mometasone-formoterol (DULERA) 200-5 MCG/ACT AERO  2 times daily     11/01/16 1247   11/01/16 0000  fluticasone (FLONASE) 50 MCG/ACT nasal spray  Daily     11/01/16 1247   11/01/16 0000  Spirometry with Graph    Question Answer Comment  Where should this test be performed? Other   Basic spirometry Yes      11/01/16 1247      Allergies  Allergen Reactions  . Emetine   .  Erythromycin   . Reglan [Metoclopramide]   . Chlordiazepoxide Hcl Rash  . Librium [Chlordiazepoxide] Rash   Review of systems: Review of systems negative except as noted in HPI / PMHx or noted below: Constitutional: Negative.  HENT: Negative.   Eyes: Negative.  Respiratory: Negative.   Cardiovascular: Negative.  Gastrointestinal: Negative.  Genitourinary: Negative.  Musculoskeletal: Negative.  Neurological: Negative.  Endo/Heme/Allergies: Negative.  Cutaneous: Negative.  Past Medical History:  Diagnosis Date  . Asthma   . Blood clot in vein   . Diabetes insipidus ([place])   . Hypercholesterolemia     Family History  Problem Relation Age of Onset  . Breast cancer Mother   . Asthma Mother   . Bronchitis Mother   . Heart disease Father     Social History  Social History  . Marital status: Married    Spouse name: N/A  . Number of children: N/A  . Years of education: N/A   Occupational History  . dentist    Social History Main Topics  . Smoking status: Former Smoker    Packs/day: 0.50    Years: 3.00    Types: Cigarettes    Quit date: 02/08/1970  . Smokeless tobacco: Never Used  . Alcohol use No  . Drug use: No  . Sexual activity: Not on file   Other Topics Concern  . Not on file   Social History Narrative  . No narrative on file    I appreciate the opportunity to take part in Bina's care. Please do not hesitate to contact me with questions.  Sincerely,   R. Edgar Frisk, MD

## 2017-03-09 ENCOUNTER — Encounter: Payer: Self-pay | Admitting: Allergy and Immunology

## 2017-03-28 ENCOUNTER — Encounter: Payer: Self-pay | Admitting: Allergy and Immunology

## 2017-03-28 ENCOUNTER — Ambulatory Visit (INDEPENDENT_AMBULATORY_CARE_PROVIDER_SITE_OTHER): Payer: Medicare Other | Admitting: Allergy and Immunology

## 2017-03-28 VITALS — BP 126/72 | HR 90 | Temp 98.3°F | Resp 16

## 2017-03-28 DIAGNOSIS — K219 Gastro-esophageal reflux disease without esophagitis: Secondary | ICD-10-CM

## 2017-03-28 DIAGNOSIS — J3089 Other allergic rhinitis: Secondary | ICD-10-CM

## 2017-03-28 DIAGNOSIS — J4540 Moderate persistent asthma, uncomplicated: Secondary | ICD-10-CM

## 2017-03-28 MED ORDER — MOMETASONE FURO-FORMOTEROL FUM 200-5 MCG/ACT IN AERO
2.0000 | INHALATION_SPRAY | Freq: Two times a day (BID) | RESPIRATORY_TRACT | 5 refills | Status: DC
Start: 2017-03-28 — End: 2018-02-20

## 2017-03-28 NOTE — Patient Instructions (Addendum)
Moderate persistent asthma Currently well controlled.  I discussed stepping down therapy to Dulera 100-5 g, however the patient would prefer for now staying at [facility] 200-5 g.  For now, continue Dulera 200-5 g, 2 inhalations via spacer device daily, montelukast 10 mg daily at bedtime, and albuterol every 6 hours if needed.  During upper respiratory tract infections and asthma flares, step up dosing to 2 inhalations via spacer device twice daily until symptoms have returned to baseline.  If subjective and objective measures of pulmonary function remain stable, we will consider stepping down therapy on the next visit.  Allergic rhinitis Stable.  Continue appropriate allergen avoidance measures and fluticasone nasal spray, 2 sprays per nostril daily if needed.  Nasal saline spray (i.e., Simply Saline) or nasal saline lavage (i.e., NeilMed) is recommended as needed and prior to medicated nasal sprays.  For thick post nasal drainage, add guaifenesin 600 mg (Mucinex)  twice daily as needed with adequate hydration as discussed.  Gastroesophageal reflux disease without esophagitis Controlled.  Continue appropriate reflux lifestyle modifications and ranitidine 300 mg daily.   Return in about 4 -5 months, or if symptoms worsen or fail to improve.

## 2017-03-28 NOTE — Assessment & Plan Note (Signed)
Stable.  Continue appropriate allergen avoidance measures and fluticasone nasal spray, 2 sprays per nostril daily if needed.  Nasal saline spray (i.e., Simply Saline) or nasal saline lavage (i.e., NeilMed) is recommended as needed and prior to medicated nasal sprays.  For thick post nasal drainage, add guaifenesin 600 mg (Mucinex)  twice daily as needed with adequate hydration as discussed.

## 2017-03-28 NOTE — Progress Notes (Signed)
Follow-up Note  RE: Madison Ball MRN: [id number] DOB: Jun 23, 1950 Date of Office Visit: 03/28/2017  Primary care provider: Lucianne Lei, MD Referring provider: Lucianne Lei, MD  History of present illness: Madison Ball is a 67 y.o. female with persistent asthma, allergic rhinitis, gastroesophageal reflux disease, and history of persistent cough presenting today for follow-up.  She was last seen in this clinic in September 2018.  She reports that in the interval since her previous visit she has had only one minor asthma flare.  This flare was associated with an upper respiratory tract infection.  Dulera 200-5 g, 2 inhalations via spacer device daily.  She has no nasal symptom complaints today.  She reports that, despite having discontinued Nexium, her acid reflux has been well controlled while taking ranitidine.    Assessment and plan: Moderate persistent asthma Currently well controlled.  I discussed stepping down therapy to Dulera 100-5 g, however the patient would prefer for now staying at [facility] 200-5 g.  For now, continue Dulera 200-5 g, 2 inhalations via spacer device daily, montelukast 10 mg daily at bedtime, and albuterol every 6 hours if needed.  During upper respiratory tract infections and asthma flares, step up dosing to 2 inhalations via spacer device twice daily until symptoms have returned to baseline.  If subjective and objective measures of pulmonary function remain stable, we will consider stepping down therapy on the next visit.  Allergic rhinitis Stable.  Continue appropriate allergen avoidance measures and fluticasone nasal spray, 2 sprays per nostril daily if needed.  Nasal saline spray (i.e., Simply Saline) or nasal saline lavage (i.e., NeilMed) is recommended as needed and prior to medicated nasal sprays.  For thick post nasal drainage, add guaifenesin 600 mg (Mucinex)  twice daily as needed with adequate hydration as discussed.  Gastroesophageal  reflux disease without esophagitis Controlled.  Continue appropriate reflux lifestyle modifications and ranitidine 300 mg daily.   Meds ordered this encounter  Medications  . mometasone-formoterol (DULERA) 200-5 MCG/ACT AERO    Sig: Inhale 2 puffs into the lungs 2 (two) times daily.    Dispense:  1 Inhaler    Refill:  5    Diagnostics: Spirometry:  Normal with an FEV1 of 113% predicted.  Please see scanned spirometry results for details.    Physical examination: Blood pressure 126/72, pulse 90, temperature 98.3 F (36.8 C), temperature source Oral, resp. rate 16, SpO2 98 %.  General: Alert, interactive, in no acute distress. HEENT: TMs pearly gray, turbinates mildly edematous without discharge, post-pharynx unremarkable. Neck: Supple without lymphadenopathy. Lungs: Clear to auscultation without wheezing, rhonchi or rales. CV: Normal S1, S2 without murmurs. Skin: Warm and dry, without lesions or rashes.  The following portions of the patient's history were reviewed and updated as appropriate: allergies, current medications, past family history, past medical history, past social history, past surgical history and problem list.  Allergies as of 03/28/2017      Reactions   Emetine    Erythromycin    Reglan [metoclopramide]    Chlordiazepoxide Hcl Rash   Librium [chlordiazepoxide] Rash      Medication List        Accurate as of 03/28/17  8:21 PM. Always use your most recent med list.          fluticasone 50 MCG/ACT nasal spray Commonly known as:  FLONASE Place 2 sprays into both nostrils daily.   MAXZIDE-25 37.5-25 MG tablet Generic drug:  triamterene-hydrochlorothiazide Take 1 tablet by mouth Daily.   metFORMIN  500 MG tablet Commonly known as:  GLUCOPHAGE Take 1 tablet by mouth Daily.   mometasone-formoterol 200-5 MCG/ACT Aero Commonly known as:  DULERA Inhale 2 puffs into the lungs 2 (two) times daily.   montelukast 10 MG tablet Commonly known as:   SINGULAIR Take 1 tablet (10 mg total) by mouth at bedtime.   PROAIR HFA 108 (90 Base) MCG/ACT inhaler Generic drug:  albuterol Inhale 2 puffs into the lungs every 4 (four) hours as needed for wheezing or shortness of breath.   ranitidine 300 MG capsule Commonly known as:  ZANTAC Take 1 capsule (300 mg total) by mouth every evening.   Vitamin E 400 units Tabs Take 2 tablets by mouth daily.   VYTORIN 10-40 MG tablet Generic drug:  ezetimibe-simvastatin Take 1 tablet by mouth Daily.       Allergies  Allergen Reactions  . Emetine   . Erythromycin   . Reglan [Metoclopramide]   . Chlordiazepoxide Hcl Rash  . Librium [Chlordiazepoxide] Rash   Review of systems: Review of systems negative except as noted in HPI / PMHx or noted below: Constitutional: Negative.  HENT: Negative.   Eyes: Negative.  Respiratory: Negative.   Cardiovascular: Negative.  Gastrointestinal: Negative.  Genitourinary: Negative.  Musculoskeletal: Negative.  Neurological: Negative.  Endo/Heme/Allergies: Negative.  Cutaneous: Negative.  Past Medical History:  Diagnosis Date  . Asthma   . Blood clot in vein   . Diabetes insipidus ([place])   . Hypercholesterolemia     Family History  Problem Relation Age of Onset  . Breast cancer Mother   . Asthma Mother   . Bronchitis Mother   . Heart disease Father     Social History   Socioeconomic History  . Marital status: Married    Spouse name: Not on file  . Number of children: Not on file  . Years of education: Not on file  . Highest education level: Not on file  Social Needs  . Financial resource strain: Not on file  . Food insecurity - worry: Not on file  . Food insecurity - inability: Not on file  . Transportation needs - medical: Not on file  . Transportation needs - non-medical: Not on file  Occupational History  . Occupation: dentist  Tobacco Use  . Smoking status: Former Smoker    Packs/day: 0.50    Years: 3.00    Pack years: 1.50     Types: Cigarettes    Last attempt to quit: 02/08/1970    Years since quitting: 47.1  . Smokeless tobacco: Never Used  Substance and Sexual Activity  . Alcohol use: No  . Drug use: No  . Sexual activity: Not on file  Other Topics Concern  . Not on file  Social History Narrative  . Not on file    I appreciate the opportunity to take part in Madison Ball's care. Please do not hesitate to contact me with questions.  Sincerely,   R. Edgar Frisk, MD

## 2017-03-28 NOTE — Assessment & Plan Note (Addendum)
Currently well controlled.  I discussed stepping down therapy to Dulera 100-5 g, however the patient would prefer for now staying at [facility] 200-5 g.  For now, continue Dulera 200-5 g, 2 inhalations via spacer device daily, montelukast 10 mg daily at bedtime, and albuterol every 6 hours if needed.  During upper respiratory tract infections and asthma flares, step up dosing to 2 inhalations via spacer device twice daily until symptoms have returned to baseline.  If subjective and objective measures of pulmonary function remain stable, we will consider stepping down therapy on the next visit.

## 2017-03-28 NOTE — Assessment & Plan Note (Signed)
Controlled.  Continue appropriate reflux lifestyle modifications and ranitidine 300 mg daily.

## 2017-04-04 ENCOUNTER — Ambulatory Visit: Payer: Medicare Other | Admitting: Allergy and Immunology

## 2017-05-16 ENCOUNTER — Other Ambulatory Visit: Payer: Self-pay | Admitting: Family Medicine

## 2017-05-16 DIAGNOSIS — F17200 Nicotine dependence, unspecified, uncomplicated: Secondary | ICD-10-CM

## 2017-05-16 DIAGNOSIS — F17218 Nicotine dependence, cigarettes, with other nicotine-induced disorders: Secondary | ICD-10-CM

## 2017-05-24 ENCOUNTER — Ambulatory Visit: Payer: Medicare Other

## 2017-05-30 ENCOUNTER — Ambulatory Visit
Admission: RE | Admit: 2017-05-30 | Discharge: 2017-05-30 | Disposition: A | Discharge status: Home / Self Care | Payer: Medicare Other | Source: Ambulatory Visit | Attending: Family Medicine | Admitting: Family Medicine

## 2017-05-30 ENCOUNTER — Other Ambulatory Visit: Payer: Self-pay | Admitting: Family Medicine

## 2017-05-30 DIAGNOSIS — F17218 Nicotine dependence, cigarettes, with other nicotine-induced disorders: Secondary | ICD-10-CM

## 2017-05-30 DIAGNOSIS — F17200 Nicotine dependence, unspecified, uncomplicated: Secondary | ICD-10-CM

## 2017-06-08 ENCOUNTER — Other Ambulatory Visit: Payer: Self-pay | Admitting: Allergy and Immunology

## 2017-08-15 ENCOUNTER — Ambulatory Visit (INDEPENDENT_AMBULATORY_CARE_PROVIDER_SITE_OTHER): Payer: Medicare Other | Admitting: Allergy and Immunology

## 2017-08-15 ENCOUNTER — Encounter: Payer: Self-pay | Admitting: Allergy and Immunology

## 2017-08-15 VITALS — BP 112/70 | HR 81 | Resp 18

## 2017-08-15 DIAGNOSIS — K219 Gastro-esophageal reflux disease without esophagitis: Secondary | ICD-10-CM

## 2017-08-15 DIAGNOSIS — J3089 Other allergic rhinitis: Secondary | ICD-10-CM

## 2017-08-15 DIAGNOSIS — R05 Cough: Secondary | ICD-10-CM

## 2017-08-15 DIAGNOSIS — J4540 Moderate persistent asthma, uncomplicated: Secondary | ICD-10-CM

## 2017-08-15 DIAGNOSIS — R053 Chronic cough: Secondary | ICD-10-CM

## 2017-08-15 MED ORDER — MOMETASONE FURO-FORMOTEROL FUM 100-5 MCG/ACT IN AERO: 2.0000 | INHALATION_SPRAY | Freq: Two times a day (BID) | RESPIRATORY_TRACT | 5 refills | Status: DC

## 2017-08-15 NOTE — Assessment & Plan Note (Signed)
Stable.  Continue appropriate allergen avoidance measures, nasal saline irrigation as needed, and fluticasone nasal spray, 2 sprays per nostril daily if needed.  For thick post nasal drainage, add guaifenesin 600 mg (Mucinex)  twice daily as needed with adequate hydration.

## 2017-08-15 NOTE — Progress Notes (Signed)
Follow-up Note  RE: Madison Ball MRN: [id number] DOB: 06-17-1950 Date of Office Visit: 08/15/2017  Primary care provider: Lucianne Lei, MD Referring provider: Lucianne Lei, MD  History of present illness: Madison Ball is a 67 y.o. female with persistent asthma, allergic rhinitis, gastroesophageal reflux disease, and history of persistent cough presenting today for follow-up.  She was last seen in this clinic on March 28, 2017.  She reports that in the interval since her previous visit her upper and lower respiratory symptoms have been well controlled.  She has not been troubled by the persistent cough, except during a bout of bronchitis approximately 1 month ago.  She is currently taking Dulera 200-5 g, 2 inhalations via spacer device once daily.  Her nasal symptoms are well controlled with nasal saline rinse and fluticasone nasal spray daily.  She has no reflux related complaints today.  Assessment and plan: Moderate persistent asthma Currently well-controlled.  A sample and prescription have been provided for Dulera 100-5 g, 2 inhalations via spacer device twice daily.  If lower respiratory symptoms progress in frequency and/or severity, the patient is to resume Dulera 200-5 g.  For now, continue montelukast 10 mg daily at bedtime and albuterol HFA, 1 to 2 inhalations every 6 hours if needed.  Subjective and objective measures of pulmonary function will be followed and the treatment plan will be adjusted accordingly.  Allergic rhinitis Stable.  Continue appropriate allergen avoidance measures, nasal saline irrigation as needed, and fluticasone nasal spray, 2 sprays per nostril daily if needed.  For thick post nasal drainage, add guaifenesin 600 mg (Mucinex)  twice daily as needed with adequate hydration.  Gastroesophageal reflux disease without esophagitis  Continue appropriate reflux lifestyle modifications and ranitidine 300 mg daily.  Cough, persistent Currently  controlled.  Treatment plan as outlined above.   Meds ordered this encounter  Medications  . mometasone-formoterol (DULERA) 100-5 MCG/ACT AERO    Sig: Inhale 2 puffs into the lungs 2 (two) times daily.    Dispense:  1 Inhaler    Refill:  5    Diagnostics: Spirometry:  Normal with an FEV1 of 109% predicted.  Please see scanned spirometry results for details.    Physical examination: Blood pressure 112/70, pulse 81, resp. rate 18, SpO2 96 %.  General: Alert, interactive, in no acute distress. HEENT: TMs pearly gray, turbinates mildly edematous without discharge, post-pharynx unremarkable. Neck: Supple without lymphadenopathy. Lungs: Clear to auscultation without wheezing, rhonchi or rales. CV: Normal S1, S2 without murmurs. Skin: Warm and dry, without lesions or rashes.  The following portions of the patient's history were reviewed and updated as appropriate: allergies, current medications, past family history, past medical history, past social history, past surgical history and problem list.  Allergies as of 08/15/2017      Reactions   Emetine    Erythromycin    Reglan [metoclopramide]    Chlordiazepoxide Hcl Rash   Librium [chlordiazepoxide] Rash      Medication List        Accurate as of 08/15/17 11:56 AM. Always use your most recent med list.          fluticasone 50 MCG/ACT nasal spray Commonly known as:  FLONASE Place 2 sprays into both nostrils daily.   MAXZIDE-25 37.5-25 MG tablet Generic drug:  triamterene-hydrochlorothiazide Take 1 tablet by mouth Daily.   metFORMIN 500 MG tablet Commonly known as:  GLUCOPHAGE Take 1 tablet by mouth Daily.   mometasone-formoterol 200-5 MCG/ACT Aero Commonly known as:  DULERA Inhale 2 puffs into the lungs 2 (two) times daily.   mometasone-formoterol 100-5 MCG/ACT Aero Commonly known as:  DULERA Inhale 2 puffs into the lungs 2 (two) times daily.   montelukast 10 MG tablet Commonly known as:  SINGULAIR Take 1 tablet  (10 mg total) by mouth at bedtime.   PROAIR HFA 108 (90 Base) MCG/ACT inhaler Generic drug:  albuterol Inhale 2 puffs into the lungs every 4 (four) hours as needed for wheezing or shortness of breath.   ranitidine 300 MG capsule Commonly known as:  ZANTAC Take 1 capsule (300 mg total) by mouth every evening.   Vitamin E 400 units Tabs Take 2 tablets by mouth daily.   VYTORIN 10-40 MG tablet Generic drug:  ezetimibe-simvastatin Take 1 tablet by mouth Daily.       Allergies  Allergen Reactions  . Emetine   . Erythromycin   . Reglan [Metoclopramide]   . Chlordiazepoxide Hcl Rash  . Librium [Chlordiazepoxide] Rash   Review of systems: Review of systems negative except as noted in HPI / PMHx or noted below: Constitutional: Negative.  HENT: Negative.   Eyes: Negative.  Respiratory: Negative.   Cardiovascular: Negative.  Gastrointestinal: Negative.  Genitourinary: Negative.  Musculoskeletal: Negative.  Neurological: Negative.  Endo/Heme/Allergies: Negative.  Cutaneous: Negative.  Past Medical History:  Diagnosis Date  . Asthma   . Blood clot in vein   . Diabetes insipidus ([place])   . Eczema   . Hypercholesterolemia   . Recurrent upper respiratory infection (URI)     Family History  Problem Relation Age of Onset  . Breast cancer Mother   . Asthma Mother   . Bronchitis Mother   . Heart disease Father     Social History   Socioeconomic History  . Marital status: Married    Spouse name: Not on file  . Number of children: Not on file  . Years of education: Not on file  . Highest education level: Not on file  Occupational History  . Occupation: Pharmacist, community  Social Needs  . Financial resource strain: Not on file  . Food insecurity:    Worry: Not on file    Inability: Not on file  . Transportation needs:    Medical: Not on file    Non-medical: Not on file  Tobacco Use  . Smoking status: Former Smoker    Packs/day: 0.50    Years: 3.00    Pack years: 1.50     Types: Cigarettes    Last attempt to quit: 02/08/1970    Years since quitting: 47.5  . Smokeless tobacco: Never Used  Substance and Sexual Activity  . Alcohol use: No  . Drug use: No  . Sexual activity: Not on file  Lifestyle  . Physical activity:    Days per week: Not on file    Minutes per session: Not on file  . Stress: Not on file  Relationships  . Social connections:    Talks on phone: Not on file    Gets together: Not on file    Attends religious service: Not on file    Active member of club or organization: Not on file    Attends meetings of clubs or organizations: Not on file    Relationship status: Not on file  . Intimate partner violence:    Fear of current or ex partner: Not on file    Emotionally abused: Not on file    Physically abused: Not on file    Forced sexual  activity: Not on file  Other Topics Concern  . Not on file  Social History Narrative  . Not on file    I appreciate the opportunity to take part in Jakiya's care. Please do not hesitate to contact me with questions.  Sincerely,   R. Edgar Frisk, MD

## 2017-08-15 NOTE — Assessment & Plan Note (Signed)
Currently well-controlled.  A sample and prescription have been provided for Dulera 100-5 g, 2 inhalations via spacer device twice daily.  If lower respiratory symptoms progress in frequency and/or severity, the patient is to resume Dulera 200-5 g.  For now, continue montelukast 10 mg daily at bedtime and albuterol HFA, 1 to 2 inhalations every 6 hours if needed.  Subjective and objective measures of pulmonary function will be followed and the treatment plan will be adjusted accordingly.

## 2017-08-15 NOTE — Assessment & Plan Note (Signed)
   Continue appropriate reflux lifestyle modifications and ranitidine 300 mg daily.

## 2017-08-15 NOTE — Patient Instructions (Signed)
Moderate persistent asthma Currently well-controlled.  A sample and prescription have been provided for Dulera 100-5 g, 2 inhalations via spacer device twice daily.  If lower respiratory symptoms progress in frequency and/or severity, the patient is to resume Dulera 200-5 g.  For now, continue montelukast 10 mg daily at bedtime and albuterol HFA, 1 to 2 inhalations every 6 hours if needed.  Subjective and objective measures of pulmonary function will be followed and the treatment plan will be adjusted accordingly.  Allergic rhinitis Stable.  Continue appropriate allergen avoidance measures, nasal saline irrigation as needed, and fluticasone nasal spray, 2 sprays per nostril daily if needed.  For thick post nasal drainage, add guaifenesin 600 mg (Mucinex)  twice daily as needed with adequate hydration.  Gastroesophageal reflux disease without esophagitis  Continue appropriate reflux lifestyle modifications and ranitidine 300 mg daily.  Cough, persistent Currently controlled.  Treatment plan as outlined above.   Return in about 5 months (around 01/15/2018), or if symptoms worsen or fail to improve.

## 2017-08-15 NOTE — Assessment & Plan Note (Signed)
Currently controlled.  Treatment plan as outlined above.

## 2017-08-16 ENCOUNTER — Other Ambulatory Visit: Payer: Self-pay | Admitting: Allergy and Immunology

## 2017-08-16 DIAGNOSIS — J3089 Other allergic rhinitis: Secondary | ICD-10-CM

## 2017-08-16 DIAGNOSIS — J4540 Moderate persistent asthma, uncomplicated: Secondary | ICD-10-CM

## 2018-01-10 ENCOUNTER — Other Ambulatory Visit: Payer: Self-pay | Admitting: Family Medicine

## 2018-01-10 DIAGNOSIS — D37032 Neoplasm of uncertain behavior of the submandibular salivary glands: Secondary | ICD-10-CM

## 2018-01-11 ENCOUNTER — Ambulatory Visit
Admission: RE | Admit: 2018-01-11 | Discharge: 2018-01-11 | Disposition: A | Discharge status: Home / Self Care | Payer: Medicare Other | Source: Ambulatory Visit | Attending: Family Medicine | Admitting: Family Medicine

## 2018-01-11 ENCOUNTER — Ambulatory Visit (HOSPITAL_COMMUNITY): Admission: RE | Admit: 2018-01-11 | Payer: Medicare Other | Source: Ambulatory Visit

## 2018-01-11 ENCOUNTER — Other Ambulatory Visit: Payer: Medicare Other

## 2018-01-11 DIAGNOSIS — D37032 Neoplasm of uncertain behavior of the submandibular salivary glands: Secondary | ICD-10-CM

## 2018-01-11 MED ORDER — IOHEXOL 300 MG/ML  SOLN
75.0000 mL | Freq: Once | INTRAMUSCULAR | Status: AC | PRN
  Administered 2018-01-11: 75 mL via INTRAVENOUS

## 2018-02-20 ENCOUNTER — Ambulatory Visit (INDEPENDENT_AMBULATORY_CARE_PROVIDER_SITE_OTHER): Payer: Medicare Other | Admitting: Allergy and Immunology

## 2018-02-20 ENCOUNTER — Encounter: Payer: Self-pay | Admitting: Allergy and Immunology

## 2018-02-20 VITALS — BP 138/84 | HR 87 | Temp 98.2°F | Resp 16

## 2018-02-20 DIAGNOSIS — J3089 Other allergic rhinitis: Secondary | ICD-10-CM

## 2018-02-20 DIAGNOSIS — J4541 Moderate persistent asthma with (acute) exacerbation: Secondary | ICD-10-CM

## 2018-02-20 DIAGNOSIS — J4540 Moderate persistent asthma, uncomplicated: Secondary | ICD-10-CM

## 2018-02-20 DIAGNOSIS — K219 Gastro-esophageal reflux disease without esophagitis: Secondary | ICD-10-CM | POA: Diagnosis not present

## 2018-02-20 DIAGNOSIS — J45901 Unspecified asthma with (acute) exacerbation: Secondary | ICD-10-CM | POA: Insufficient documentation

## 2018-02-20 MED ORDER — FAMOTIDINE 20 MG PO TABS: 20.0000 mg | ORAL_TABLET | Freq: Two times a day (BID) | ORAL | 5 refills | Status: AC

## 2018-02-20 MED ORDER — FLUTICASONE PROPIONATE 50 MCG/ACT NA SUSP: 2.0000 | Freq: Every day | NASAL | 5 refills | Status: DC

## 2018-02-20 MED ORDER — MONTELUKAST SODIUM 10 MG PO TABS: ORAL_TABLET | ORAL | 1 refills | Status: DC

## 2018-02-20 MED ORDER — PREDNISONE 10 MG PO TABS: ORAL_TABLET | ORAL | 0 refills | Status: DC

## 2018-02-20 MED ORDER — ALBUTEROL SULFATE HFA 108 (90 BASE) MCG/ACT IN AERS: 2.0000 | INHALATION_SPRAY | RESPIRATORY_TRACT | 2 refills | Status: DC | PRN

## 2018-02-20 MED ORDER — OMEPRAZOLE 20 MG PO CPDR: 20.0000 mg | DELAYED_RELEASE_CAPSULE | Freq: Every day | ORAL | 2 refills | Status: AC

## 2018-02-20 NOTE — Patient Instructions (Addendum)
Asthmatic bronchitis  Prednisone has been provided, 40 mg x4 days, 20 mg x2 day, 10 mg x1 day, then stop.  Continue Dulera 100-5 g, 2 inhalations via spacer device twice daily, montelukast 10 mg daily at bedtime, and albuterol HFA, 1 to 2 inhalations every 4-6 hours as needed.  The patient has been asked to contact me if her symptoms persist or progress. Otherwise, she may return for follow up in 5 months.  Gastroesophageal reflux disease without esophagitis  Continue appropriate reflux lifestyle modifications.  Switch from ranitidine to famotidine (Pepcid) 20 mg twice daily.  During episodes of persistent cough, add omeprazole 20 mg daily, 30 minutes prior to breakfast.  Allergic rhinitis  Continue appropriate allergen avoidance measures.  For now, use nasal saline irrigation followed by fluticasone nasal spray twice daily until symptoms have returned to baseline.    For thick post nasal drainage, add guaifenesin [phone] mg (Mucinex)  twice daily as needed with adequate hydration.   Return in about 5 months (around 07/22/2018), or if symptoms worsen or fail to improve.

## 2018-02-20 NOTE — Assessment & Plan Note (Signed)
   Continue appropriate reflux lifestyle modifications.  Switch from ranitidine to famotidine (Pepcid) 20 mg twice daily.  During episodes of persistent cough, add omeprazole 20 mg daily, 30 minutes prior to breakfast.

## 2018-02-20 NOTE — Assessment & Plan Note (Signed)
   Prednisone has been provided, 40 mg x4 days, 20 mg x2 day, 10 mg x1 day, then stop.  Continue Dulera 100-5 g, 2 inhalations via spacer device twice daily, montelukast 10 mg daily at bedtime, and albuterol HFA, 1 to 2 inhalations every 4-6 hours as needed.  The patient has been asked to contact me if her symptoms persist or progress. Otherwise, she may return for follow up in 5 months.

## 2018-02-20 NOTE — Assessment & Plan Note (Addendum)
   Continue appropriate allergen avoidance measures.  For now, use nasal saline irrigation followed by fluticasone nasal spray twice daily until symptoms have returned to baseline.    For thick post nasal drainage, add guaifenesin [phone] mg (Mucinex)  twice daily as needed with adequate hydration.

## 2018-02-20 NOTE — Progress Notes (Signed)
Follow-up Note  RE: Madison Ball MRN: [id number] DOB: 1950-03-29 Date of Office Visit: 02/20/2018  Primary care provider: Lucianne Lei, MD Referring provider: Lucianne Lei, MD  History of present illness: Madison Ball is a 68 y.o. female with persistent asthma, allergic rhinitis, and gastroesophageal reflux presenting today for a sick visit.  She reports that over the past 3 or 4 days she has experienced thick postnasal drainage and a severe cough.  She denies fevers, chills, and fetid mucus production.  She denies sinus pressure/pain.  Prior to this episode, she reports that her asthma has been well controlled with Dulera 100-5 g, 2 inhalations via spacer device twice daily, and montelukast 10 mg daily.  She is currently taking ranitidine to control reflux.  He is aware that ranitidine has been taken off the market but has not discontinued this medication yet.  Assessment and plan: Asthmatic bronchitis  Prednisone has been provided, 40 mg x4 days, 20 mg x2 day, 10 mg x1 day, then stop.  Continue Dulera 100-5 g, 2 inhalations via spacer device twice daily, montelukast 10 mg daily at bedtime, and albuterol HFA, 1 to 2 inhalations every 4-6 hours as needed.  The patient has been asked to contact me if her symptoms persist or progress. Otherwise, she may return for follow up in 5 months.  Gastroesophageal reflux disease without esophagitis  Continue appropriate reflux lifestyle modifications.  Switch from ranitidine to famotidine (Pepcid) 20 mg twice daily.  During episodes of persistent cough, add omeprazole 20 mg daily, 30 minutes prior to breakfast.  Allergic rhinitis  Continue appropriate allergen avoidance measures.  For now, use nasal saline irrigation followed by fluticasone nasal spray twice daily until symptoms have returned to baseline.    For thick post nasal drainage, add guaifenesin [phone] mg (Mucinex)  twice daily as needed with adequate  hydration.   Meds ordered this encounter  Medications  . albuterol (PROAIR HFA) 108 (90 Base) MCG/ACT inhaler    Sig: Inhale 2 puffs into the lungs every 4 (four) hours as needed for wheezing or shortness of breath.    Dispense:  1 Inhaler    Refill:  2  . fluticasone (FLONASE) 50 MCG/ACT nasal spray    Sig: Place 2 sprays into both nostrils daily.    Dispense:  16 g    Refill:  5  . montelukast (SINGULAIR) 10 MG tablet    Sig: TAKE 1 TABLET BY MOUTH EVERYDAY AT BEDTIME    Dispense:  90 tablet    Refill:  1  . famotidine (PEPCID) 20 MG tablet    Sig: Take 1 tablet (20 mg total) by mouth 2 (two) times daily.    Dispense:  60 tablet    Refill:  5  . omeprazole (PRILOSEC) 20 MG capsule    Sig: Take 1 capsule (20 mg total) by mouth daily.    Dispense:  30 capsule    Refill:  2  . predniSONE (DELTASONE) 10 MG tablet    Sig: Take 40 mg x 4 days then 20 mg x 2 days then 10 mg x 1 day    Dispense:  21 tablet    Refill:  0    Diagnostics: Spirometry:  Normal with an FEV1 of 108% predicted.  Please see scanned spirometry results for details.    Physical examination: Blood pressure 138/84, pulse 87, temperature 98.2 F (36.8 C), temperature source Oral, resp. rate 16, SpO2 98 %.  General: Alert, interactive, in no acute  distress. HEENT: TMs pearly gray, turbinates moderately edematous without discharge, post-pharynx erythematous. Neck: Supple without lymphadenopathy. Lungs: Clear to auscultation without wheezing, rhonchi or rales. CV: Normal S1, S2 without murmurs. Skin: Warm and dry, without lesions or rashes.  The following portions of the patient's history were reviewed and updated as appropriate: allergies, current medications, past family history, past medical history, past social history, past surgical history and problem list.  Allergies as of 02/20/2018      Reactions   Emetine    Erythromycin    Reglan [metoclopramide]    Chlordiazepoxide Hcl Rash   Librium  [chlordiazepoxide] Rash      Medication List       Accurate as of February 20, 2018  9:21 PM. Always use your most recent med list.        albuterol 108 (90 Base) MCG/ACT inhaler Commonly known as:  PROAIR HFA Inhale 2 puffs into the lungs every 4 (four) hours as needed for wheezing or shortness of breath.   famotidine 20 MG tablet Commonly known as:  PEPCID Take 1 tablet (20 mg total) by mouth 2 (two) times daily.   fluticasone 50 MCG/ACT nasal spray Commonly known as:  FLONASE Place 2 sprays into both nostrils daily.   JARDIANCE 10 MG Tabs tablet Generic drug:  empagliflozin   MAXZIDE-25 37.5-25 MG tablet Generic drug:  triamterene-hydrochlorothiazide Take 1 tablet by mouth Daily.   metFORMIN 500 MG tablet Commonly known as:  GLUCOPHAGE Take 1 tablet by mouth Daily.   mometasone-formoterol 100-5 MCG/ACT Aero Commonly known as:  DULERA Inhale 2 puffs into the lungs 2 (two) times daily.   montelukast 10 MG tablet Commonly known as:  SINGULAIR TAKE 1 TABLET BY MOUTH EVERYDAY AT BEDTIME   omeprazole 20 MG capsule Commonly known as:  PRILOSEC Take 1 capsule (20 mg total) by mouth daily.   predniSONE 10 MG tablet Commonly known as:  DELTASONE Take 40 mg x 4 days then 20 mg x 2 days then 10 mg x 1 day   ranitidine 300 MG capsule Commonly known as:  ZANTAC Take 1 capsule (300 mg total) by mouth every evening.   Vitamin E 400 units Tabs Take 2 tablets by mouth daily.   VYTORIN 10-40 MG tablet Generic drug:  ezetimibe-simvastatin Take 1 tablet by mouth Daily.       Allergies  Allergen Reactions  . Emetine   . Erythromycin   . Reglan [Metoclopramide]   . Chlordiazepoxide Hcl Rash  . Librium [Chlordiazepoxide] Rash   Review of systems: Review of systems negative except as noted in HPI / PMHx or noted below: Constitutional: Negative.  HENT: Negative.   Eyes: Negative.  Respiratory: Negative.   Cardiovascular: Negative.  Gastrointestinal: Negative.   Genitourinary: Negative.  Musculoskeletal: Negative.  Neurological: Negative.  Endo/Heme/Allergies: Negative.  Cutaneous: Negative.  Past Medical History:  Diagnosis Date  . Asthma   . Blood clot in vein   . Diabetes insipidus ([place])   . Eczema   . Hypercholesterolemia   . Recurrent upper respiratory infection (URI)     Family History  Problem Relation Age of Onset  . Breast cancer Mother   . Asthma Mother   . Bronchitis Mother   . Heart disease Father     Social History   Socioeconomic History  . Marital status: Married    Spouse name: Not on file  . Number of children: Not on file  . Years of education: Not on file  . Highest education level:  Not on file  Occupational History  . Occupation: Pharmacist, community  Social Needs  . Financial resource strain: Not on file  . Food insecurity:    Worry: Not on file    Inability: Not on file  . Transportation needs:    Medical: Not on file    Non-medical: Not on file  Tobacco Use  . Smoking status: Former Smoker    Packs/day: 0.50    Years: 3.00    Pack years: 1.50    Types: Cigarettes    Last attempt to quit: 02/08/1970    Years since quitting: 48.0  . Smokeless tobacco: Never Used  Substance and Sexual Activity  . Alcohol use: No  . Drug use: No  . Sexual activity: Not on file  Lifestyle  . Physical activity:    Days per week: Not on file    Minutes per session: Not on file  . Stress: Not on file  Relationships  . Social connections:    Talks on phone: Not on file    Gets together: Not on file    Attends religious service: Not on file    Active member of club or organization: Not on file    Attends meetings of clubs or organizations: Not on file    Relationship status: Not on file  . Intimate partner violence:    Fear of current or ex partner: Not on file    Emotionally abused: Not on file    Physically abused: Not on file    Forced sexual activity: Not on file  Other Topics Concern  . Not on file  Social  History Narrative  . Not on file    I appreciate the opportunity to take part in Jari's care. Please do not hesitate to contact me with questions.  Sincerely,   R. Edgar Frisk, MD

## 2018-07-24 ENCOUNTER — Ambulatory Visit: Payer: Medicare Other | Admitting: Allergy and Immunology

## 2018-09-07 ENCOUNTER — Other Ambulatory Visit: Payer: Self-pay | Admitting: Allergy and Immunology

## 2018-09-18 ENCOUNTER — Ambulatory Visit: Payer: Medicare Other | Admitting: Allergy and Immunology

## 2018-11-13 ENCOUNTER — Ambulatory Visit (INDEPENDENT_AMBULATORY_CARE_PROVIDER_SITE_OTHER): Payer: Medicare Other | Admitting: Allergy and Immunology

## 2018-11-13 ENCOUNTER — Encounter: Payer: Self-pay | Admitting: Allergy and Immunology

## 2018-11-13 ENCOUNTER — Other Ambulatory Visit: Payer: Self-pay

## 2018-11-13 VITALS — BP 118/88 | HR 79 | Temp 97.5°F | Resp 18 | Ht 61.0 in | Wt 131.1 lb

## 2018-11-13 DIAGNOSIS — K219 Gastro-esophageal reflux disease without esophagitis: Secondary | ICD-10-CM | POA: Diagnosis not present

## 2018-11-13 DIAGNOSIS — J3089 Other allergic rhinitis: Secondary | ICD-10-CM | POA: Diagnosis not present

## 2018-11-13 DIAGNOSIS — R053 Chronic cough: Secondary | ICD-10-CM

## 2018-11-13 DIAGNOSIS — R05 Cough: Secondary | ICD-10-CM

## 2018-11-13 DIAGNOSIS — J4541 Moderate persistent asthma with (acute) exacerbation: Secondary | ICD-10-CM

## 2018-11-13 MED ORDER — DULERA 200-5 MCG/ACT IN AERO: 2.0000 | INHALATION_SPRAY | Freq: Two times a day (BID) | RESPIRATORY_TRACT | 0 refills | Status: DC

## 2018-11-13 MED ORDER — AZELASTINE HCL 0.1 % NA SOLN: 1.0000 | Freq: Two times a day (BID) | NASAL | 5 refills | Status: AC | PRN

## 2018-11-13 NOTE — Assessment & Plan Note (Signed)
Multifactorial.  Prednisone has been provided, 20 mg x 4 days, 10 mg x1 day, then stop.  Treatment plan as outlined below.

## 2018-11-13 NOTE — Patient Instructions (Addendum)
Cough, persistent Multifactorial.  Prednisone has been provided, 20 mg x 4 days, 10 mg x1 day, then stop.  Treatment plan as outlined below.  Moderate persistent asthma  A prescription has been provided for [facility] (mometasone/formoterol) 200/5 g, 2 inhalations via spacer device twice a day.  Once symptoms have returned to baseline, may resume Dulera 100/5 g dose.  Continue montelukast 10 mg daily at bedtime and albuterol HFA, 1 to 2 inhalations every 4-6 hours if needed.  The patient has been asked to contact me if her symptoms persist or progress. Otherwise, she may return for follow up in 4 months.  Allergic rhinitis  Continue appropriate allergen avoidance measures.  A prescription has been provided for azelastine nasal spray, 1-2 sprays per nostril 2 times daily as needed. Proper nasal spray technique has been discussed and demonstrated.   Nasal saline lavage (NeilMed) has been recommended as needed and prior to medicated nasal sprays along with instructions for proper administration.  For thick post nasal drainage, add guaifenesin [phone] mg (Mucinex)  twice daily as needed with adequate hydration.  Gastroesophageal reflux disease without esophagitis  Continue appropriate reflux lifestyle modifications.  Famotidine (Pepcid) 20 mg twice daily and omeprazole 20 mg daily, 30 minutes prior to breakfast.   Return in about 4 months (around 03/16/2019), or if symptoms worsen or fail to improve.

## 2018-11-13 NOTE — Assessment & Plan Note (Signed)
   Continue appropriate allergen avoidance measures.  A prescription has been provided for azelastine nasal spray, 1-2 sprays per nostril 2 times daily as needed. Proper nasal spray technique has been discussed and demonstrated.   Nasal saline lavage (NeilMed) has been recommended as needed and prior to medicated nasal sprays along with instructions for proper administration.  For thick post nasal drainage, add guaifenesin [phone] mg (Mucinex)  twice daily as needed with adequate hydration.

## 2018-11-13 NOTE — Assessment & Plan Note (Signed)
   A prescription has been provided for [facility] (mometasone/formoterol) 200/5 g, 2 inhalations via spacer device twice a day.  Once symptoms have returned to baseline, may resume Dulera 100/5 g dose.  Continue montelukast 10 mg daily at bedtime and albuterol HFA, 1 to 2 inhalations every 4-6 hours if needed.  The patient has been asked to contact me if her symptoms persist or progress. Otherwise, she may return for follow up in 4 months.

## 2018-11-13 NOTE — Assessment & Plan Note (Signed)
   Continue appropriate reflux lifestyle modifications.  Famotidine (Pepcid) 20 mg twice daily and omeprazole 20 mg daily, 30 minutes prior to breakfast.

## 2018-11-13 NOTE — Progress Notes (Signed)
Follow-up Note  RE: Madison Ball MRN: [id number] DOB: 1950/12/28 Date of Office Visit: 11/13/2018  Primary care provider: Lucianne Lei, MD Referring provider: Lucianne Lei, MD  History of present illness: Madison Ball is a 68 y.o. female with persistent asthma, allergic rhinitis, and esophageal reflux presented today for a sick visit.  She was last seen in this clinic on February 20, 2018.  She reports that over the past 2 weeks she has experienced a cough which is worse at nighttime despite taking Prilosec and Pepcid.  She is currently taking Dulera 100-5 g, 2 inhalations in the morning and 1 inhalation at night.  She reports that she only experiences shortness of breath with hot flashes.  She states that the cough originates in the upper chest "at the bronchial tree."  She admits to having postnasal drainage and a globus sensation.  She is using nasal saline lavage once a day, however is not used fluticasone nasal spray over the past 2 weeks.  Assessment and plan: Cough, persistent Multifactorial.  Prednisone has been provided, 20 mg x 4 days, 10 mg x1 day, then stop.  Treatment plan as outlined below.  Moderate persistent asthma  A prescription has been provided for Madison Ball (mometasone/formoterol) 200/5 g, 2 inhalations via spacer device twice a day.  Once symptoms have returned to baseline, may resume Dulera 100/5 g dose.  Continue montelukast 10 mg daily at bedtime and albuterol HFA, 1 to 2 inhalations every 4-6 hours if needed.  The patient has been asked to contact me if her symptoms persist or progress. Otherwise, she may return for follow up in 4 months.  Allergic rhinitis  Continue appropriate allergen avoidance measures.  A prescription has been provided for azelastine nasal spray, 1-2 sprays per nostril 2 times daily as needed. Proper nasal spray technique has been discussed and demonstrated.   Nasal saline lavage (NeilMed) has been recommended as needed and  prior to medicated nasal sprays along with instructions for proper administration.  For thick post nasal drainage, add guaifenesin [id number] mg (Mucinex)  twice daily as needed with adequate hydration.  Gastroesophageal reflux disease without esophagitis  Continue appropriate reflux lifestyle modifications.  Famotidine (Pepcid) 20 mg twice daily and omeprazole 20 mg daily, 30 minutes prior to breakfast.   Meds ordered this encounter  Medications  . mometasone-formoterol (DULERA) 200-5 MCG/ACT AERO    Sig: Inhale 2 puffs into the lungs 2 (two) times daily.    Dispense:  13 g    Refill:  0  . azelastine (ASTELIN) 0.1 % nasal spray    Sig: Place 1-2 sprays into both nostrils 2 (two) times daily as needed for rhinitis.    Dispense:  30 mL    Refill:  5    Diagnostics: Spirometry:  Normal with an FEV1 of 108 % predicted. This study was performed while the patient was asymptomatic.  Please see scanned spirometry results for details.    Physical examination: Blood pressure 118/88, pulse 79, temperature (!) 97.5 F (36.4 C), temperature source Temporal, resp. rate 18, height 5' 1"  (1.549 m), weight 131 lb 1.6 oz (59.5 kg), SpO2 96 %.  General: Alert, interactive, in no acute distress. HEENT: TMs pearly gray, turbinates moderately edematous without discharge, post-pharynx moderately erythematous. Neck: Supple without lymphadenopathy. Lungs: Clear to auscultation without wheezing, rhonchi or rales. CV: Normal S1, S2 without murmurs. Skin: Warm and dry, without lesions or rashes.  The following portions of the patient's history were reviewed and updated as appropriate:  allergies, current medications, past family history, past medical history, past social history, past surgical history and problem list.   Current Outpatient Medications  Medication Sig Dispense Refill  . albuterol (PROAIR HFA) 108 (90 Base) MCG/ACT inhaler Inhale 2 puffs into the lungs every 4 (four) hours as needed for  wheezing or shortness of breath. 1 Inhaler 2  . famotidine (PEPCID) 20 MG tablet Take 1 tablet (20 mg total) by mouth 2 (two) times daily. 60 tablet 5  . fluticasone (FLONASE) 50 MCG/ACT nasal spray Place 2 sprays into both nostrils daily. 16 g 5  . JARDIANCE 10 MG TABS tablet     . MAXZIDE-25 37.5-25 MG per tablet Take 1 tablet by mouth Daily.    . metFORMIN (GLUCOPHAGE) 500 MG tablet Take 1 tablet by mouth Daily.    . mometasone-formoterol (DULERA) 100-5 MCG/ACT AERO Inhale 2 puffs into the lungs 2 (two) times daily. 1 Inhaler 5  . montelukast (SINGULAIR) 10 MG tablet TAKE 1 TABLET BY MOUTH EVERYDAY AT BEDTIME 90 tablet 0  . omeprazole (PRILOSEC) 20 MG capsule Take 1 capsule (20 mg total) by mouth daily. 30 capsule 2  . Vitamin E 400 UNITS TABS Take 2 tablets by mouth daily.      Marland Kitchen VYTORIN 10-40 MG per tablet Take 1 tablet by mouth Daily.    Marland Kitchen azelastine (ASTELIN) 0.1 % nasal spray Place 1-2 sprays into both nostrils 2 (two) times daily as needed for rhinitis. 30 mL 5  . mometasone-formoterol (DULERA) 200-5 MCG/ACT AERO Inhale 2 puffs into the lungs 2 (two) times daily. 13 g 0  . predniSONE (DELTASONE) 10 MG tablet Take 40 mg x 4 days then 20 mg x 2 days then 10 mg x 1 day (Patient not taking: Reported on 11/13/2018) 21 tablet 0  . ranitidine (ZANTAC) 300 MG capsule Take 1 capsule (300 mg total) by mouth every evening. (Patient not taking: Reported on 11/13/2018) 30 capsule 5   No current facility-administered medications for this visit.     Allergies  Allergen Reactions  . Emetine   . Erythromycin   . Reglan [Metoclopramide]   . Chlordiazepoxide Hcl Rash  . Librium [Chlordiazepoxide] Rash   Review of systems: Review of systems negative except as noted in HPI / PMHx or noted below: Constitutional: Negative.  HENT: Negative.   Eyes: Negative.  Respiratory: Negative.   Cardiovascular: Negative.  Gastrointestinal: Negative.  Genitourinary: Negative.  Musculoskeletal: Negative.   Neurological: Negative.  Endo/Heme/Allergies: Negative.  Cutaneous: Negative.  Past Medical History:  Diagnosis Date  . Asthma   . Blood clot in vein   . Diabetes insipidus ([place])   . Eczema   . Hypercholesterolemia   . Recurrent upper respiratory infection (URI)     Family History  Problem Relation Age of Onset  . Breast cancer Mother   . Asthma Mother   . Bronchitis Mother   . Heart disease Father     Social History   Socioeconomic History  . Marital status: Married    Spouse name: Not on file  . Number of children: Not on file  . Years of education: Not on file  . Highest education level: Not on file  Occupational History  . Occupation: Pharmacist, community  Social Needs  . Financial resource strain: Not on file  . Food insecurity    Worry: Not on file    Inability: Not on file  . Transportation needs    Medical: Not on file    Non-medical: Not on  file  Tobacco Use  . Smoking status: Former Smoker    Packs/day: 0.50    Years: 3.00    Pack years: 1.50    Types: Cigarettes    Quit date: 02/08/1970    Years since quitting: 48.7  . Smokeless tobacco: Never Used  Substance and Sexual Activity  . Alcohol use: No  . Drug use: No  . Sexual activity: Not on file  Lifestyle  . Physical activity    Days per week: Not on file    Minutes per session: Not on file  . Stress: Not on file  Relationships  . Social Herbalist on phone: Not on file    Gets together: Not on file    Attends religious service: Not on file    Active member of club or organization: Not on file    Attends meetings of clubs or organizations: Not on file    Relationship status: Not on file  . Intimate partner violence    Fear of current or ex partner: Not on file    Emotionally abused: Not on file    Physically abused: Not on file    Forced sexual activity: Not on file  Other Topics Concern  . Not on file  Social History Narrative  . Not on file    I appreciate the opportunity to take  part in Eilis's care. Please do not hesitate to contact me with questions.  Sincerely,   R. Edgar Frisk, MD

## 2018-11-28 ENCOUNTER — Other Ambulatory Visit: Payer: Self-pay | Admitting: *Deleted

## 2018-11-28 MED ORDER — MONTELUKAST SODIUM 10 MG PO TABS: 10.0000 mg | ORAL_TABLET | Freq: Every day | ORAL | 1 refills | Status: DC

## 2018-12-10 ENCOUNTER — Other Ambulatory Visit: Payer: Self-pay | Admitting: Allergy and Immunology

## 2019-02-05 ENCOUNTER — Inpatient Hospital Stay (HOSPITAL_BASED_OUTPATIENT_CLINIC_OR_DEPARTMENT_OTHER)
Admission: EM | Admit: 2019-02-05 | Discharge: 2019-02-10 | DRG: 872 | Disposition: A | Discharge status: Home / Self Care | Payer: Medicare Other | Attending: Internal Medicine | Admitting: Internal Medicine

## 2019-02-05 ENCOUNTER — Encounter (HOSPITAL_BASED_OUTPATIENT_CLINIC_OR_DEPARTMENT_OTHER): Payer: Self-pay | Admitting: *Deleted

## 2019-02-05 ENCOUNTER — Emergency Department (HOSPITAL_BASED_OUTPATIENT_CLINIC_OR_DEPARTMENT_OTHER): Payer: Medicare Other

## 2019-02-05 ENCOUNTER — Other Ambulatory Visit: Payer: Self-pay

## 2019-02-05 DIAGNOSIS — Z7982 Long term (current) use of aspirin: Secondary | ICD-10-CM

## 2019-02-05 DIAGNOSIS — Z87891 Personal history of nicotine dependence: Secondary | ICD-10-CM

## 2019-02-05 DIAGNOSIS — Z79899 Other long term (current) drug therapy: Secondary | ICD-10-CM

## 2019-02-05 DIAGNOSIS — R1012 Left upper quadrant pain: Secondary | ICD-10-CM | POA: Diagnosis not present

## 2019-02-05 DIAGNOSIS — E785 Hyperlipidemia, unspecified: Secondary | ICD-10-CM | POA: Diagnosis present

## 2019-02-05 DIAGNOSIS — Z20822 Contact with and (suspected) exposure to covid-19: Secondary | ICD-10-CM | POA: Diagnosis present

## 2019-02-05 DIAGNOSIS — Z825 Family history of asthma and other chronic lower respiratory diseases: Secondary | ICD-10-CM

## 2019-02-05 DIAGNOSIS — E119 Type 2 diabetes mellitus without complications: Secondary | ICD-10-CM

## 2019-02-05 DIAGNOSIS — Z888 Allergy status to other drugs, medicaments and biological substances status: Secondary | ICD-10-CM

## 2019-02-05 DIAGNOSIS — K529 Noninfective gastroenteritis and colitis, unspecified: Secondary | ICD-10-CM | POA: Diagnosis present

## 2019-02-05 DIAGNOSIS — A419 Sepsis, unspecified organism: Secondary | ICD-10-CM | POA: Diagnosis not present

## 2019-02-05 DIAGNOSIS — Z90710 Acquired absence of both cervix and uterus: Secondary | ICD-10-CM

## 2019-02-05 DIAGNOSIS — E232 Diabetes insipidus: Secondary | ICD-10-CM | POA: Diagnosis present

## 2019-02-05 DIAGNOSIS — I890 Lymphedema, not elsewhere classified: Secondary | ICD-10-CM | POA: Diagnosis present

## 2019-02-05 DIAGNOSIS — E876 Hypokalemia: Secondary | ICD-10-CM

## 2019-02-05 DIAGNOSIS — K559 Vascular disorder of intestine, unspecified: Secondary | ICD-10-CM | POA: Diagnosis present

## 2019-02-05 DIAGNOSIS — Z7984 Long term (current) use of oral hypoglycemic drugs: Secondary | ICD-10-CM

## 2019-02-05 DIAGNOSIS — K922 Gastrointestinal hemorrhage, unspecified: Secondary | ICD-10-CM

## 2019-02-05 DIAGNOSIS — L309 Dermatitis, unspecified: Secondary | ICD-10-CM | POA: Diagnosis present

## 2019-02-05 DIAGNOSIS — Z881 Allergy status to other antibiotic agents status: Secondary | ICD-10-CM

## 2019-02-05 DIAGNOSIS — K5150 Left sided colitis without complications: Secondary | ICD-10-CM | POA: Diagnosis present

## 2019-02-05 DIAGNOSIS — J45909 Unspecified asthma, uncomplicated: Secondary | ICD-10-CM | POA: Diagnosis present

## 2019-02-05 LAB — COMPREHENSIVE METABOLIC PANEL
ALT: 27 U/L (ref 0–44)
AST: 36 U/L (ref 15–41)
Albumin: 4.1 g/dL (ref 3.5–5.0)
Alkaline Phosphatase: 56 U/L (ref 38–126)
Anion gap: 16 — ABNORMAL HIGH (ref 5–15)
BUN: 17 mg/dL (ref 8–23)
CO2: 30 mmol/L (ref 22–32)
Calcium: 9.6 mg/dL (ref 8.9–10.3)
Chloride: 94 mmol/L — ABNORMAL LOW (ref 98–111)
Creatinine, Ser: 1.13 mg/dL — ABNORMAL HIGH (ref 0.44–1.00)
GFR calc Af Amer: 57 mL/min — ABNORMAL LOW (ref >60)
GFR calc non Af Amer: 49 mL/min — ABNORMAL LOW (ref >60)
Glucose, Bld: 116 mg/dL — ABNORMAL HIGH (ref 70–99)
Potassium: 2.7 mmol/L — CL (ref 3.5–5.1)
Sodium: 140 mmol/L (ref 135–145)
Total Bilirubin: 1.4 mg/dL — ABNORMAL HIGH (ref 0.3–1.2)
Total Protein: 7.7 g/dL (ref 6.5–8.1)

## 2019-02-05 LAB — URINALYSIS, ROUTINE W REFLEX MICROSCOPIC
Bilirubin Urine: NEGATIVE
Glucose, UA: >=500 mg/dL — AB
Hgb urine dipstick: NEGATIVE
Ketones, ur: NEGATIVE mg/dL
Leukocytes,Ua: NEGATIVE
Nitrite: NEGATIVE
Protein, ur: NEGATIVE mg/dL
Specific Gravity, Urine: 1.01 (ref 1.005–1.030)
pH: 5.5 (ref 5.0–8.0)

## 2019-02-05 LAB — LIPASE, BLOOD: Lipase: 28 U/L (ref 11–51)

## 2019-02-05 LAB — URINALYSIS, MICROSCOPIC (REFLEX)

## 2019-02-05 LAB — CBC WITH DIFFERENTIAL/PLATELET
Abs Immature Granulocytes: 0.03 10*3/uL (ref 0.00–0.07)
Basophils Absolute: 0 10*3/uL (ref 0.0–0.1)
Basophils Relative: 0 %
Eosinophils Absolute: 0 10*3/uL (ref 0.0–0.5)
Eosinophils Relative: 0 %
HCT: 51.8 % — ABNORMAL HIGH (ref 36.0–46.0)
Hemoglobin: 17.2 g/dL — ABNORMAL HIGH (ref 12.0–15.0)
Immature Granulocytes: 0 %
Lymphocytes Relative: 18 %
Lymphs Abs: 2.2 10*3/uL (ref 0.7–4.0)
MCH: 29.9 pg (ref 26.0–34.0)
MCHC: 33.2 g/dL (ref 30.0–36.0)
MCV: 89.9 fL (ref 80.0–100.0)
Monocytes Absolute: 0.9 10*3/uL (ref 0.1–1.0)
Monocytes Relative: 7 %
Neutro Abs: 9.3 10*3/uL — ABNORMAL HIGH (ref 1.7–7.7)
Neutrophils Relative %: 75 %
Platelets: 239 10*3/uL (ref 150–400)
RBC: 5.76 MIL/uL — ABNORMAL HIGH (ref 3.87–5.11)
RDW: 15.3 % (ref 11.5–15.5)
WBC: 12.5 10*3/uL — ABNORMAL HIGH (ref 4.0–10.5)
nRBC: 0 % (ref 0.0–0.2)

## 2019-02-05 LAB — SARS CORONAVIRUS 2 AG (30 MIN TAT): SARS Coronavirus 2 Ag: NEGATIVE

## 2019-02-05 LAB — LACTIC ACID, PLASMA
Lactic Acid, Venous: 2.6 mmol/L (ref 0.5–1.9)
Lactic Acid, Venous: 2 mmol/L (ref 0.5–1.9)

## 2019-02-05 LAB — OCCULT BLOOD X 1 CARD TO LAB, STOOL: Fecal Occult Bld: POSITIVE — AB

## 2019-02-05 MED ORDER — SODIUM CHLORIDE 0.9 % IV BOLUS
500.0000 mL | Freq: Once | INTRAVENOUS | Status: AC
  Administered 2019-02-05: 500 mL via INTRAVENOUS

## 2019-02-05 MED ORDER — POTASSIUM CHLORIDE CRYS ER 20 MEQ PO TBCR
40.0000 meq | EXTENDED_RELEASE_TABLET | Freq: Once | ORAL | Status: AC
  Administered 2019-02-05: 20:00:00 40 meq via ORAL
  Filled 2019-02-05: qty 2

## 2019-02-05 MED ORDER — SODIUM CHLORIDE 0.9 % IV BOLUS
1000.0000 mL | Freq: Once | INTRAVENOUS | Status: AC
  Administered 2019-02-05: 21:00:00 1000 mL via INTRAVENOUS

## 2019-02-05 MED ORDER — METRONIDAZOLE IN NACL 5-0.79 MG/ML-% IV SOLN
500.0000 mg | Freq: Once | INTRAVENOUS | Status: AC
  Administered 2019-02-05: 500 mg via INTRAVENOUS
  Filled 2019-02-05: qty 100

## 2019-02-05 MED ORDER — ONDANSETRON HCL 4 MG/2ML IJ SOLN
4.0000 mg | Freq: Once | INTRAMUSCULAR | Status: AC
  Administered 2019-02-05: 4 mg via INTRAVENOUS
  Filled 2019-02-05: qty 2

## 2019-02-05 MED ORDER — SODIUM CHLORIDE 0.9 % IV BOLUS
1000.0000 mL | Freq: Once | INTRAVENOUS | Status: AC
  Administered 2019-02-05: 19:00:00 1000 mL via INTRAVENOUS

## 2019-02-05 MED ORDER — ACETAMINOPHEN 325 MG PO TABS
650.0000 mg | ORAL_TABLET | Freq: Once | ORAL | Status: AC
  Administered 2019-02-05: 19:00:00 650 mg via ORAL
  Filled 2019-02-05: qty 2

## 2019-02-05 MED ORDER — MORPHINE SULFATE (PF) 4 MG/ML IV SOLN
4.0000 mg | Freq: Once | INTRAVENOUS | Status: AC
  Administered 2019-02-05: 4 mg via INTRAVENOUS
  Filled 2019-02-05: qty 1

## 2019-02-05 MED ORDER — SODIUM CHLORIDE 0.9 % IV SOLN
2.0000 g | Freq: Once | INTRAVENOUS | Status: AC
  Administered 2019-02-05: 21:00:00 2 g via INTRAVENOUS
  Filled 2019-02-05: qty 20

## 2019-02-05 MED ORDER — IOHEXOL 300 MG/ML  SOLN
100.0000 mL | Freq: Once | INTRAMUSCULAR | Status: AC | PRN
  Administered 2019-02-05: 20:00:00 100 mL via INTRAVENOUS

## 2019-02-05 NOTE — ED Notes (Signed)
[facility] notified [facility]) - patient ready for transport

## 2019-02-05 NOTE — ED Notes (Signed)
Madison Ball, [place] notified of pt's lactic acid of 2.6.

## 2019-02-05 NOTE — ED Notes (Signed)
ED Provider at bedside. 

## 2019-02-05 NOTE — ED Notes (Signed)
Pt reports that her Dr. Collene Mares is her GI doctor.

## 2019-02-05 NOTE — ED Provider Notes (Addendum)
[facility] EMERGENCY DEPARTMENT Provider Note   CSN: [id number] Arrival date & time: 02/05/19  1631     History Chief Complaint  Patient presents with  . Abdominal Pain    Madison Ball is a 68 y.o. female.  HPI   Pt is as 68 y/o female with a h/o HLD, who presents to the ED today for eval of abd pain that started this AM around 1am. Pain located to the LUQ of the abdomen. Pain rated as severe. She also reports abd cramping, nausea, vomiting, and diarrhea. Reports 1 episode of blood tinged vomit. States that after many episodes of diarrhea she started having bloody BMs. Reports 4 episodes of bright red blood per rectum. Last episode was a few hours PTA. Denies fevers, sweats, chills. Denies urinary sxs, chest pain, or sob. Denies use of blood thinners.  Denies heavy use of NSAIDs. Uses alcohol seldomly. 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 Asthma   . Blood clot in vein   . Diabetes insipidus ([place])   . Eczema   . Hypercholesterolemia   . Recurrent upper respiratory infection (URI)     Patient Active Problem List   Diagnosis Date Noted  . Colitis 02/05/2019  . Asthmatic bronchitis 02/20/2018  . Moderate persistent asthma 07/28/2015  . Gastroesophageal reflux disease without esophagitis 07/28/2015  . Coughing 01/20/2015  . Allergic rhinitis 12/16/2014  . Cough, persistent 10/28/2010    Past Surgical History:  Procedure Laterality Date  . ANKLE SURGERY Right   . TOTAL ABDOMINAL HYSTERECTOMY  1990     OB History   No obstetric history on file.     Family History  Problem Relation Age of Onset  . Breast cancer Mother   . Asthma Mother   . Bronchitis Mother   . Heart disease Father     Social History   Tobacco Use  . Smoking status: Former Smoker    Packs/day: 0.50    Years: 3.00    Pack years: 1.50    Types: Cigarettes    Quit  date: 02/08/1970    Years since quitting: 49.0  . Smokeless tobacco: Never Used  Substance Use Topics  . Alcohol use: No  . Drug use: No    Home Medications Prior to Admission medications   Medication Sig Start Date End Date Taking? Authorizing Provider  albuterol (PROAIR HFA) 108 (90 Base) MCG/ACT inhaler Inhale 2 puffs into the lungs every 4 (four) hours as needed for wheezing or shortness of breath. 02/20/18   Bobbitt, Sedalia Muta, MD  azelastine (ASTELIN) 0.1 % nasal spray Place 1-2 sprays into both nostrils 2 (two) times daily as needed for rhinitis. 11/13/18   Bobbitt, Sedalia Muta, MD  DULERA 200-5 MCG/ACT AERO TAKE 2 PUFFS BY MOUTH TWICE A DAY 12/11/18   Bobbitt, Sedalia Muta, MD  famotidine (PEPCID) 20 MG tablet Take 1 tablet (20 mg total) by mouth 2 (two) times daily. 02/20/18   Bobbitt, Sedalia Muta, MD  fluticasone (FLONASE) 50 MCG/ACT nasal spray Place 2 sprays into both nostrils daily. 02/20/18   Bobbitt, Sedalia Muta, MD  JARDIANCE 10 MG TABS tablet  01/10/18   [provider]  MAXZIDE-25 37.5-25 MG per tablet Take 1 tablet by mouth Daily. 10/19/10   [provider]  metFORMIN (GLUCOPHAGE) 500 MG tablet Take 1 tablet by mouth Daily. 10/13/10   [provider]  mometasone-formoterol (DULERA) 100-5 MCG/ACT AERO Inhale 2 puffs into the lungs 2 (two) times daily. 08/15/17   Bobbitt, Sedalia Muta, MD  montelukast (SINGULAIR) 10 MG tablet Take 1 tablet (10 mg total) by mouth at bedtime. 11/28/18   Bobbitt, Sedalia Muta, MD  omeprazole (PRILOSEC) 20 MG capsule Take 1 capsule (20 mg total) by mouth daily. 02/20/18   Bobbitt, Sedalia Muta, MD  predniSONE (DELTASONE) 10 MG tablet Take 40 mg x 4 days then 20 mg x 2 days then 10 mg x 1 day Patient not taking: Reported on 11/13/2018 02/20/18   Bobbitt, Sedalia Muta, MD  ranitidine (ZANTAC) 300 MG capsule Take 1 capsule (300 mg total) by mouth every evening. Patient not taking: Reported on 11/13/2018 11/01/16   Bobbitt, Sedalia Muta, MD  Vitamin E 400 UNITS TABS Take 2 tablets by mouth daily.      [provider]  VYTORIN 10-40 MG per tablet Take 1 tablet by mouth Daily. 10/13/10   [provider]    Allergies    Emetine, Erythromycin, Reglan [metoclopramide], Chlordiazepoxide hcl, and Librium [chlordiazepoxide]  Review of Systems   Review of Systems  Constitutional: Negative for chills and fever.  HENT: Negative for ear pain and sore throat.   Eyes: Negative for visual disturbance.  Respiratory: Negative for cough and shortness of breath.   Cardiovascular: Negative for chest pain.  Gastrointestinal: Positive for abdominal pain, diarrhea, nausea and vomiting.       Bloody stools, blood tinged sputum  Genitourinary: Negative for dysuria and hematuria.  Musculoskeletal: Negative for back pain.  Skin: Negative for rash.  Neurological: Negative for seizures and syncope.  All other systems reviewed and are negative.   Physical Exam Updated Vital Signs BP 100/74   Pulse 84   Temp 100.1 F (37.8 C) Comment: taken by EDP  Resp 14   Ht 5' 1"  (1.549 m)   Wt 62.1 kg   SpO2 (!) 88%   BMI 25.89 kg/m   Physical Exam Vitals and nursing note reviewed.  Constitutional:      General: She is not in acute distress.    Appearance: She is well-developed.  HENT:     Head: Normocephalic and atraumatic.  Eyes:     Conjunctiva/sclera: Conjunctivae normal.  Cardiovascular:     Rate and Rhythm: Regular rhythm. Tachycardia present.     Heart sounds: No murmur.  Pulmonary:     Effort: Pulmonary effort is normal. No respiratory distress.     Breath sounds: Normal breath sounds. No wheezing, rhonchi or rales.  Abdominal:     General: Bowel sounds are normal.     Palpations: Abdomen is soft.     Tenderness: There is abdominal tenderness in the left upper quadrant and left lower quadrant. There is guarding. There is no rebound.  Genitourinary:    Comments: Digital rectal exam was completed.  There  were no external hemorrhoids visualized.  There was bright red blood noted in the rectal vault. Musculoskeletal:     Cervical back: Neck supple.  Skin:    General: Skin is warm and dry.  Neurological:     Mental Status: She is alert.     ED Results / Procedures / Treatments   Labs (all labs ordered are listed, but only abnormal results are displayed) Labs Reviewed  CBC WITH DIFFERENTIAL/PLATELET - Abnormal; Notable for the following components:      Result Value   WBC 12.5 (*)    RBC 5.76 (*)  Hemoglobin 17.2 (*)    HCT 51.8 (*)    Neutro Abs 9.3 (*)    All other components within normal limits  COMPREHENSIVE METABOLIC PANEL - Abnormal; Notable for the following components:   Potassium 2.7 (*)    Chloride 94 (*)    Glucose, Bld 116 (*)    Creatinine, Ser 1.13 (*)    Total Bilirubin 1.4 (*)    GFR calc non Af Amer 49 (*)    GFR calc Af Amer 57 (*)    Anion gap 16 (*)    All other components within normal limits  OCCULT BLOOD X 1 CARD TO LAB, STOOL - Abnormal; Notable for the following components:   Fecal Occult Bld POSITIVE (*)    All other components within normal limits  LACTIC ACID, PLASMA - Abnormal; Notable for the following components:   Lactic Acid, Venous 2.6 (*)    All other components within normal limits  LACTIC ACID, PLASMA - Abnormal; Notable for the following components:   Lactic Acid, Venous 2.0 (*)    All other components within normal limits  SARS CORONAVIRUS 2 AG (30 MIN TAT)  CULTURE, BLOOD (ROUTINE X 2)  CULTURE, BLOOD (ROUTINE X 2)  URINE CULTURE  SARS CORONAVIRUS 2 (TAT 6-24 HRS)  LIPASE, BLOOD  URINALYSIS, ROUTINE W REFLEX MICROSCOPIC    EKG EKG Interpretation  Date/Time:  Monday February 05 2019 20:28:26 EST Ventricular Rate:  88 PR Interval:    QRS Duration: 126 QT Interval:  377 QTC Calculation: 457 R Axis:   96 Text Interpretation: Sinus rhythm Prolonged PR interval Left bundle branch block Borderline T abnormalities, inferior  leads Borderline ST elevation, anterior leads No previous ECGs available Confirmed by Gerlene Fee [phone]) on 02/05/2019 8:56:47 PM   Radiology CT ABDOMEN PELVIS W CONTRAST  Result Date: 02/05/2019 CLINICAL DATA:  Nausea vomiting diarrhea EXAM: CT ABDOMEN AND PELVIS WITH CONTRAST TECHNIQUE: Multidetector CT imaging of the abdomen and pelvis was performed using the standard protocol following bolus administration of intravenous contrast. CONTRAST:  162m OMNIPAQUE IOHEXOL 300 MG/ML  SOLN COMPARISON:  None. FINDINGS: Lower chest: Lung bases demonstrate no acute consolidation or pleural effusion heart size within normal limits. Hepatobiliary: No focal hepatic abnormality. No calcified gallstone or biliary dilatation Pancreas: No inflammatory change.  Mildly prominent pancreatic duct Spleen: Normal in size without focal abnormality. Adrenals/Urinary Tract: Adrenal glands are unremarkable. Kidneys are normal, without renal calculi, focal lesion, or hydronephrosis. Bladder is unremarkable. Stomach/Bowel: The stomach is nonenlarged. No dilated small bowel. The appendix is not well seen but no right lower quadrant inflammatory process. Prominent wall thickening and inflammatory change extending from the distal transverse colon to the rectosigmoid colon. Vascular/Lymphatic: Aorta is non aneurysmal. Mild aortic atherosclerosis. No significant adenopathy. Reproductive: Status post hysterectomy. No adnexal masses. Other: No free air.  Trace fluid in the left colic gutter. Musculoskeletal: 8 mm anterolisthesis L4 on L5. Advanced degenerative changes L4-L5 and L5-S1. IMPRESSION: 1. Prominent wall thickening and associated inflammatory changes extending from the distal transverse colon to the rectosigmoid colon, consistent with colitis of infectious, inflammatory, or ischemic etiology. No perforation or free air identified. Electronically Signed   By: KDonavan FoilM.D.   On: 02/05/2019 20:29   DG Chest Port 1  View  Result Date: 02/05/2019 CLINICAL DATA:  Former smoker; diarrhea; elevated WBC; FUO; h/o bronchitisfever EXAM: PORTABLE CHEST 1 VIEW COMPARISON:  Radiograph 02/19/2014 FINDINGS: Normal mediastinum and cardiac silhouette. Normal pulmonary vasculature. No evidence of effusion, infiltrate, or pneumothorax.  No acute bony abnormality. IMPRESSION: Normal chest radiograph Electronically Signed   By: Suzy Bouchard M.D.   On: 02/05/2019 20:16    Procedures Procedures (including critical care time) CRITICAL CARE Performed by: Rodney Booze   Total critical care time: 33 minutes  Critical care time was exclusive of separately billable procedures and treating other patients.  Critical care was necessary to treat or prevent imminent or life-threatening deterioration.  Critical care was time spent personally by me on the following activities: development of treatment plan with patient and/or surrogate as well as nursing, discussions with consultants, evaluation of patient's response to treatment, examination of patient, obtaining history from patient or surrogate, ordering and performing treatments and interventions, ordering and review of laboratory studies, ordering and review of radiographic studies, pulse oximetry and re-evaluation of patient's condition.   Medications Ordered in ED Medications  acetaminophen (TYLENOL) tablet 650 mg (650 mg Oral Given 02/05/19 1841)  sodium chloride 0.9 % bolus 1,000 mL (0 mLs Intravenous Stopped 02/05/19 1956)  ondansetron (ZOFRAN) injection 4 mg (4 mg Intravenous Given 02/05/19 1854)  morphine 4 MG/ML injection 4 mg (4 mg Intravenous Given 02/05/19 1857)  cefTRIAXone (ROCEPHIN) 2 g in sodium chloride 0.9 % 100 mL IVPB (0 g Intravenous Stopped 02/05/19 2121)  metroNIDAZOLE (FLAGYL) IVPB 500 mg (0 mg Intravenous Stopped 02/05/19 2133)  potassium chloride SA (KLOR-CON) CR tablet 40 mEq (40 mEq Oral Given 02/05/19 2023)  iohexol (OMNIPAQUE) 300 MG/ML  solution 100 mL (100 mLs Intravenous Contrast Given 02/05/19 1958)  sodium chloride 0.9 % bolus 1,000 mL (0 mLs Intravenous Stopped 02/05/19 2206)    ED Course  I have reviewed the triage vital signs and the nursing notes.  Pertinent labs & imaging results that were available during my care of the patient were reviewed by me and considered in my medical decision making (see chart for details).    MDM Rules/Calculators/A&P                      68 y/o f presenting with NVD, abd pain, and bloody stools starting this AM.   On arrival, found to be borderline febrile at 100.31F, marginally tachycardic with otherwise reassuring vitals.   CBC with mild leukocytosis also with elevated hemoglobin, likely hemoconcentration in setting of volume depletion from vomiting and diarrhea. CMP with hypokalemia at 2.7, supplemented with p.o. potassium.  Slightly elevated creatinine which does appear to be her baseline.  Normal BUN and liver function.  Mildly elevated anion gap Lipase negative Lactic initially elevated at 2.6, improved to 2 with IV fluids UA pending on admission Blood cultures obtained Point-of-care Covid testing negative. Fecal occult negative  EKG with Sinus rhythm Prolonged PR interval Left bundle branch block Borderline T abnormalities, inferior leads Borderline ST elevation, anterior leads No previous ECGs available  CXR neg  CT abd/pelvis with prominent wall thickening and associated inflammatory changes extending from the distal transverse colon to the rectosigmoid colon, consistent with colitis of infectious, inflammatory, or ischemic etiology.   Code sepsis initiated after pt found to have elevated WBC count and lactic acid. IVF and abx given.    8:50 PM Pt give IVF, tylenol, morphine, and on reassessment she feels much improvement. HR 90, RR 17, satting at 95% on RA, BP 101/58.   9:25 PM CONSULT with Dr. Shanon Brow who accepts patient for admission at [facility].   Case  discussed with Dr. Sedonia Small who is in agreement with plan.   Final Clinical  Impression(s) / ED Diagnoses Final diagnoses:  Colitis    Rx / DC Orders ED Discharge Orders    None       Rodney Booze, PA-C 02/05/19 2257    Keimon Basaldua S, PA-C 02/05/19 2304    Rodney Booze, PA-C 02/05/19 2322    Maudie Flakes, MD 02/06/19 1757

## 2019-02-05 NOTE — ED Triage Notes (Signed)
Abdominal pain, severe diarrhea and vomiting. Symptoms woke her at 1am.

## 2019-02-05 NOTE — ED Notes (Signed)
Date and time results received: 02/05/19 1929 (use smartphrase ".now" to insert current time)  Test: lactic acid Critical Value: 2.6  Name of Provider Notified: Dr. Sedonia Small  Orders Received? Or Actions Taken?: no new orders

## 2019-02-05 NOTE — ED Notes (Signed)
Date and time results received: 02/05/19 2113  Test: lactic acid Critical Value: 2.0  Name of Provider Notified: Couture PA  Orders Received? Or Actions Taken?: no new orders

## 2019-02-06 ENCOUNTER — Inpatient Hospital Stay (HOSPITAL_COMMUNITY): Payer: Medicare Other

## 2019-02-06 DIAGNOSIS — E876 Hypokalemia: Secondary | ICD-10-CM

## 2019-02-06 DIAGNOSIS — E119 Type 2 diabetes mellitus without complications: Secondary | ICD-10-CM | POA: Diagnosis present

## 2019-02-06 DIAGNOSIS — Z87891 Personal history of nicotine dependence: Secondary | ICD-10-CM | POA: Diagnosis not present

## 2019-02-06 DIAGNOSIS — K5150 Left sided colitis without complications: Secondary | ICD-10-CM | POA: Diagnosis present

## 2019-02-06 DIAGNOSIS — Z79899 Other long term (current) drug therapy: Secondary | ICD-10-CM | POA: Diagnosis not present

## 2019-02-06 DIAGNOSIS — E785 Hyperlipidemia, unspecified: Secondary | ICD-10-CM | POA: Diagnosis present

## 2019-02-06 DIAGNOSIS — J45909 Unspecified asthma, uncomplicated: Secondary | ICD-10-CM | POA: Diagnosis present

## 2019-02-06 DIAGNOSIS — A419 Sepsis, unspecified organism: Secondary | ICD-10-CM | POA: Diagnosis present

## 2019-02-06 DIAGNOSIS — K529 Noninfective gastroenteritis and colitis, unspecified: Secondary | ICD-10-CM

## 2019-02-06 DIAGNOSIS — Z7984 Long term (current) use of oral hypoglycemic drugs: Secondary | ICD-10-CM | POA: Diagnosis not present

## 2019-02-06 DIAGNOSIS — Z888 Allergy status to other drugs, medicaments and biological substances status: Secondary | ICD-10-CM | POA: Diagnosis not present

## 2019-02-06 DIAGNOSIS — M7989 Other specified soft tissue disorders: Secondary | ICD-10-CM

## 2019-02-06 DIAGNOSIS — R1012 Left upper quadrant pain: Secondary | ICD-10-CM | POA: Diagnosis present

## 2019-02-06 DIAGNOSIS — K559 Vascular disorder of intestine, unspecified: Secondary | ICD-10-CM | POA: Diagnosis present

## 2019-02-06 DIAGNOSIS — Z881 Allergy status to other antibiotic agents status: Secondary | ICD-10-CM | POA: Diagnosis not present

## 2019-02-06 DIAGNOSIS — Z825 Family history of asthma and other chronic lower respiratory diseases: Secondary | ICD-10-CM | POA: Diagnosis not present

## 2019-02-06 DIAGNOSIS — Z90710 Acquired absence of both cervix and uterus: Secondary | ICD-10-CM | POA: Diagnosis not present

## 2019-02-06 DIAGNOSIS — I890 Lymphedema, not elsewhere classified: Secondary | ICD-10-CM | POA: Diagnosis present

## 2019-02-06 DIAGNOSIS — Z20822 Contact with and (suspected) exposure to covid-19: Secondary | ICD-10-CM | POA: Diagnosis present

## 2019-02-06 DIAGNOSIS — L309 Dermatitis, unspecified: Secondary | ICD-10-CM | POA: Diagnosis present

## 2019-02-06 DIAGNOSIS — E232 Diabetes insipidus: Secondary | ICD-10-CM | POA: Diagnosis present

## 2019-02-06 DIAGNOSIS — Z7982 Long term (current) use of aspirin: Secondary | ICD-10-CM | POA: Diagnosis not present

## 2019-02-06 DIAGNOSIS — K922 Gastrointestinal hemorrhage, unspecified: Secondary | ICD-10-CM

## 2019-02-06 LAB — BASIC METABOLIC PANEL
Anion gap: 14 (ref 5–15)
BUN: 12 mg/dL (ref 8–23)
CO2: 25 mmol/L (ref 22–32)
Calcium: 8.2 mg/dL — ABNORMAL LOW (ref 8.9–10.3)
Chloride: 102 mmol/L (ref 98–111)
Creatinine, Ser: 1.03 mg/dL — ABNORMAL HIGH (ref 0.44–1.00)
GFR calc Af Amer: >60 mL/min (ref >60)
GFR calc non Af Amer: 55 mL/min — ABNORMAL LOW (ref >60)
Glucose, Bld: 94 mg/dL (ref 70–99)
Potassium: 2.9 mmol/L — ABNORMAL LOW (ref 3.5–5.1)
Sodium: 141 mmol/L (ref 135–145)

## 2019-02-06 LAB — D-DIMER, QUANTITATIVE: D-Dimer, Quant: 1.99 ug/mL-FEU — ABNORMAL HIGH (ref 0.00–0.50)

## 2019-02-06 LAB — C DIFFICILE QUICK SCREEN W PCR REFLEX
C Diff antigen: NEGATIVE
C Diff interpretation: NOT DETECTED
C Diff toxin: NEGATIVE

## 2019-02-06 LAB — CBC
HCT: 46.9 % — ABNORMAL HIGH (ref 36.0–46.0)
HCT: 42.7 % (ref 36.0–46.0)
Hemoglobin: 14.8 g/dL (ref 12.0–15.0)
Hemoglobin: 13.6 g/dL (ref 12.0–15.0)
MCH: 29.7 pg (ref 26.0–34.0)
MCH: 29.9 pg (ref 26.0–34.0)
MCHC: 31.6 g/dL (ref 30.0–36.0)
MCHC: 31.9 g/dL (ref 30.0–36.0)
MCV: 94.2 fL (ref 80.0–100.0)
MCV: 93.8 fL (ref 80.0–100.0)
Platelets: 213 K/uL (ref 150–400)
Platelets: 200 10*3/uL (ref 150–400)
RBC: 4.98 MIL/uL (ref 3.87–5.11)
RBC: 4.55 MIL/uL (ref 3.87–5.11)
RDW: 15.3 % (ref 11.5–15.5)
RDW: 15.3 % (ref 11.5–15.5)
WBC: 10.4 K/uL (ref 4.0–10.5)
WBC: 8.8 10*3/uL (ref 4.0–10.5)
nRBC: 0 % (ref 0.0–0.2)
nRBC: 0 % (ref 0.0–0.2)

## 2019-02-06 LAB — GLUCOSE, CAPILLARY
Glucose-Capillary: 102 mg/dL — ABNORMAL HIGH (ref 70–99)
Glucose-Capillary: 103 mg/dL — ABNORMAL HIGH (ref 70–99)
Glucose-Capillary: 105 mg/dL — ABNORMAL HIGH (ref 70–99)
Glucose-Capillary: 72 mg/dL (ref 70–99)
Glucose-Capillary: 76 mg/dL (ref 70–99)
Glucose-Capillary: 91 mg/dL (ref 70–99)

## 2019-02-06 LAB — HIV ANTIBODY (ROUTINE TESTING W REFLEX): HIV Screen 4th Generation wRfx: NONREACTIVE

## 2019-02-06 LAB — TSH: TSH: 0.599 u[IU]/mL (ref 0.350–4.500)

## 2019-02-06 LAB — SARS CORONAVIRUS 2 (TAT 6-24 HRS): SARS Coronavirus 2: NEGATIVE

## 2019-02-06 LAB — LACTIC ACID, PLASMA
Lactic Acid, Venous: 2.5 mmol/L (ref 0.5–1.9)
Lactic Acid, Venous: 1 mmol/L (ref 0.5–1.9)

## 2019-02-06 LAB — MAGNESIUM: Magnesium: 1.8 mg/dL (ref 1.7–2.4)

## 2019-02-06 LAB — HEMOGLOBIN A1C
Hgb A1c MFr Bld: 6.6 % — ABNORMAL HIGH (ref 4.8–5.6)
Mean Plasma Glucose: 142.72 mg/dL

## 2019-02-06 MED ORDER — ACETAMINOPHEN 650 MG RE SUPP: 650.0000 mg | Freq: Four times a day (QID) | RECTAL | Status: DC | PRN

## 2019-02-06 MED ORDER — ALBUTEROL SULFATE HFA 108 (90 BASE) MCG/ACT IN AERS
2.0000 | INHALATION_SPRAY | RESPIRATORY_TRACT | Status: DC | PRN
  Filled 2019-02-06: qty 6.7

## 2019-02-06 MED ORDER — POTASSIUM CHLORIDE CRYS ER 20 MEQ PO TBCR
40.0000 meq | EXTENDED_RELEASE_TABLET | ORAL | Status: AC
  Administered 2019-02-06 (×3): 40 meq via ORAL
  Filled 2019-02-06 (×3): qty 2

## 2019-02-06 MED ORDER — ONDANSETRON HCL 4 MG/2ML IJ SOLN
4.0000 mg | Freq: Four times a day (QID) | INTRAMUSCULAR | Status: DC | PRN
  Administered 2019-02-08 (×2): 4 mg via INTRAVENOUS
  Filled 2019-02-06 (×3): qty 2

## 2019-02-06 MED ORDER — INSULIN ASPART 100 UNIT/ML ~~LOC~~ SOLN
0.0000 [IU] | SUBCUTANEOUS | Status: DC
  Administered 2019-02-08: 1 [IU] via SUBCUTANEOUS

## 2019-02-06 MED ORDER — POTASSIUM CHLORIDE CRYS ER 20 MEQ PO TBCR
40.0000 meq | EXTENDED_RELEASE_TABLET | Freq: Once | ORAL | Status: AC
  Administered 2019-02-06: 40 meq via ORAL
  Filled 2019-02-06: qty 2

## 2019-02-06 MED ORDER — POTASSIUM CHLORIDE CRYS ER 20 MEQ PO TBCR
20.0000 meq | EXTENDED_RELEASE_TABLET | Freq: Once | ORAL | Status: AC
  Administered 2019-02-06: 20 meq via ORAL
  Filled 2019-02-06: qty 1

## 2019-02-06 MED ORDER — ACETAMINOPHEN 325 MG PO TABS
650.0000 mg | ORAL_TABLET | Freq: Four times a day (QID) | ORAL | Status: DC | PRN
  Administered 2019-02-06 – 2019-02-09 (×4): 650 mg via ORAL
  Filled 2019-02-06 (×4): qty 2

## 2019-02-06 MED ORDER — SODIUM CHLORIDE 0.9 % IV BOLUS
500.0000 mL | Freq: Once | INTRAVENOUS | Status: AC
  Administered 2019-02-06: 500 mL via INTRAVENOUS

## 2019-02-06 MED ORDER — SODIUM CHLORIDE 0.9 % IV SOLN: INTRAVENOUS | Status: DC | PRN

## 2019-02-06 MED ORDER — MORPHINE SULFATE (PF) 2 MG/ML IV SOLN
1.0000 mg | INTRAVENOUS | Status: DC | PRN
  Administered 2019-02-06 – 2019-02-08 (×7): 1 mg via INTRAVENOUS
  Filled 2019-02-06 (×7): qty 1

## 2019-02-06 MED ORDER — METRONIDAZOLE IN NACL 5-0.79 MG/ML-% IV SOLN
500.0000 mg | Freq: Three times a day (TID) | INTRAVENOUS | Status: DC
  Administered 2019-02-06 – 2019-02-09 (×10): 500 mg via INTRAVENOUS
  Filled 2019-02-06 (×10): qty 100

## 2019-02-06 MED ORDER — SODIUM CHLORIDE 0.9 % IV SOLN
2.0000 g | INTRAVENOUS | Status: DC
  Administered 2019-02-06 – 2019-02-08 (×3): 2 g via INTRAVENOUS
  Filled 2019-02-06 (×3): qty 2
  Filled 2019-02-06: qty 20

## 2019-02-06 MED ORDER — SODIUM CHLORIDE 0.9 % IV SOLN: INTRAVENOUS | Status: AC

## 2019-02-06 NOTE — ED Notes (Signed)
Pt placed on 2L of O2 via [place] d/t persistent O2 sats in the 80's.  Improvement in O2 sat to 95%.

## 2019-02-06 NOTE — Plan of Care (Signed)

## 2019-02-06 NOTE — Consult Note (Signed)
Reason for Consult: Left sided colitis Referring Physician: Sterling HPI: This is a 68 year old female with a PMH of hyperlipidemia and asthma admitted for complaints of left sided abdominal pain followed by bloody diarrhea.  The patient did have pain and diarrhea, but it was nonbloody initially.  As her symptoms progressed she started to have bleeding, but she reports that it was minor.  Her symptoms lasted from 1:30 AM to 4 AM.  In the hospital she was noted to have a mild elevation in her temperature at 100 F as well as a mild elevation in her WBC.  She was started on CTX and Flagyl once the CT scan showed that she had a left sided colitis, but the etiology was unknown.  Her last routine colonoscopy was with Dr. Collene Mares on 11/01/2011 with findings of hyperplastic polyps.  The patient's diarrhea has abated and there is no significant drop in her HGB  Past Medical History:  Diagnosis Date   Asthma    Blood clot in vein    Diabetes insipidus ([place])    Eczema    Hypercholesterolemia    Recurrent upper respiratory infection (URI)     Past Surgical History:  Procedure Laterality Date   ANKLE SURGERY Right    TOTAL ABDOMINAL HYSTERECTOMY  1990    Family History  Problem Relation Age of Onset   Breast cancer Mother    Asthma Mother    Bronchitis Mother    Heart disease Father     Social History:  reports that she quit smoking about 49 years ago. Her smoking use included cigarettes. She has a 1.50 pack-year smoking history. She has never used smokeless tobacco. She reports that she does not drink alcohol or use drugs.  Allergies:  Allergies  Allergen Reactions   Emetine    Erythromycin    Reglan [Metoclopramide]    Chlordiazepoxide Hcl Rash   Librium [Chlordiazepoxide] Rash    Medications:  Scheduled:  insulin aspart  0-9 Units Subcutaneous Q4H   Continuous:  sodium chloride Stopped (02/06/19 1120)   sodium chloride 75 mL/hr at 02/06/19 1120    cefTRIAXone (ROCEPHIN)  IV     metronidazole 500 mg (02/06/19 1400)    Results for orders placed or performed during the hospital encounter of 02/05/19 (from the past 24 hour(s))  Blood culture (routine x 2)     Status: None (Preliminary result)   Collection Time: 02/05/19  6:40 PM   Specimen: BLOOD  Result Value Ref Range   Specimen Description      BLOOD LEFT ANTECUBITAL Performed at [facility], [address] [place], [place] [address]    Special Requests      BOTTLES DRAWN AEROBIC AND ANAEROBIC Blood Culture adequate volume Performed at [facility], [address] [place], [place] [address]    Culture      NO GROWTH < 24 HOURS Performed at [facility], [place] [address], [place], [place] [address]    Report Status PENDING   CBC with Differential     Status: Abnormal   Collection Time: 02/05/19  6:42 PM  Result Value Ref Range   WBC 12.5 (H) 4.0 - 10.5 K/uL   RBC 5.76 (H) 3.87 - 5.11 MIL/uL   Hemoglobin 17.2 (H) 12.0 - 15.0 g/dL   HCT 51.8 (H) 36.0 - 46.0 %   MCV 89.9 80.0 - 100.0 fL   MCH 29.9 26.0 - 34.0  pg   MCHC 33.2 30.0 - 36.0 g/dL   RDW 15.3 11.5 - 15.5 %   Platelets 239 150 - 400 K/uL   nRBC 0.0 0.0 - 0.2 %   Neutrophils Relative % 75 %   Neutro Abs 9.3 (H) 1.7 - 7.7 K/uL   Lymphocytes Relative 18 %   Lymphs Abs 2.2 0.7 - 4.0 K/uL   Monocytes Relative 7 %   Monocytes Absolute 0.9 0.1 - 1.0 K/uL   Eosinophils Relative 0 %   Eosinophils Absolute 0.0 0.0 - 0.5 K/uL   Basophils Relative 0 %   Basophils Absolute 0.0 0.0 - 0.1 K/uL   Immature Granulocytes 0 %   Abs Immature Granulocytes 0.03 0.00 - 0.07 K/uL  Comprehensive metabolic panel     Status: Abnormal   Collection Time: 02/05/19  6:42 PM  Result Value Ref Range   Sodium 140 135 - 145 mmol/L   Potassium 2.7 (LL) 3.5 - 5.1 mmol/L   Chloride 94 (L) 98 - 111 mmol/L   CO2 30 22 - 32 mmol/L   Glucose, Bld 116 (H) 70 - 99 mg/dL   BUN 17 8 - 23 mg/dL   Creatinine, Ser 1.13  (H) 0.44 - 1.00 mg/dL   Calcium 9.6 8.9 - 10.3 mg/dL   Total Protein 7.7 6.5 - 8.1 g/dL   Albumin 4.1 3.5 - 5.0 g/dL   AST 36 15 - 41 U/L   ALT 27 0 - 44 U/L   Alkaline Phosphatase 56 38 - 126 U/L   Total Bilirubin 1.4 (H) 0.3 - 1.2 mg/dL   GFR calc non Af Amer 49 (L) >60 mL/min   GFR calc Af Amer 57 (L) >60 mL/min   Anion gap 16 (H) 5 - 15  Lipase, blood     Status: None   Collection Time: 02/05/19  6:42 PM  Result Value Ref Range   Lipase 28 11 - 51 U/L  SARS Coronavirus 2 Ag (30 min TAT) - Nasal Swab (BD Veritor Kit)     Status: None   Collection Time: 02/05/19  6:42 PM   Specimen: Nasal Swab (BD Veritor Kit)  Result Value Ref Range   SARS Coronavirus 2 Ag NEGATIVE NEGATIVE  Occult blood card to lab, stool     Status: Abnormal   Collection Time: 02/05/19  6:42 PM  Result Value Ref Range   Fecal Occult Bld POSITIVE (A) NEGATIVE  Lactic acid, plasma     Status: Abnormal   Collection Time: 02/05/19  6:42 PM  Result Value Ref Range   Lactic Acid, Venous 2.6 (HH) 0.5 - 1.9 mmol/L  Blood culture (routine x 2)     Status: None (Preliminary result)   Collection Time: 02/05/19  6:42 PM   Specimen: BLOOD  Result Value Ref Range   Specimen Description      BLOOD RIGHT ANTECUBITAL Performed at [facility], [address] [place], [place] [address]    Special Requests      BOTTLES DRAWN AEROBIC AND ANAEROBIC Blood Culture adequate volume Performed at [facility], [address] [place], [place] [address]    Culture      NO GROWTH < 24 HOURS Performed at [facility], [place] [address], [place], [place] [address]    Report Status PENDING   Lactic acid, plasma     Status: Abnormal   Collection Time: 02/05/19  8:37 PM  Result Value Ref Range   Lactic Acid,  Venous 2.0 (HH) 0.5 - 1.9 mmol/L  SARS CORONAVIRUS 2 (TAT 6-24 HRS) Nasopharyngeal Nasopharyngeal Swab     Status: None   Collection Time: 02/05/19  9:08 PM   Specimen: Nasopharyngeal Swab  Result  Value Ref Range   SARS Coronavirus 2 NEGATIVE NEGATIVE  Urinalysis, Routine w reflex microscopic     Status: Abnormal   Collection Time: 02/05/19 10:49 PM  Result Value Ref Range   Color, Urine YELLOW YELLOW   APPearance CLEAR CLEAR   Specific Gravity, Urine 1.010 1.005 - 1.030   pH 5.5 5.0 - 8.0   Glucose, UA >=500 (A) NEGATIVE mg/dL   Hgb urine dipstick NEGATIVE NEGATIVE   Bilirubin Urine NEGATIVE NEGATIVE   Ketones, ur NEGATIVE NEGATIVE mg/dL   Protein, ur NEGATIVE NEGATIVE mg/dL   Nitrite NEGATIVE NEGATIVE   Leukocytes,Ua NEGATIVE NEGATIVE  Urinalysis, Microscopic (reflex)     Status: Abnormal   Collection Time: 02/05/19 10:49 PM  Result Value Ref Range   RBC / HPF 0-5 0 - 5 RBC/hpf   WBC, UA 0-5 0 - 5 WBC/hpf   Bacteria, UA RARE (A) NONE SEEN   Squamous Epithelial / LPF 6-10 0 - 5   Budding Yeast PRESENT   Glucose, capillary     Status: Abnormal   Collection Time: 02/06/19  4:07 AM  Result Value Ref Range   Glucose-Capillary 103 (H) 70 - 99 mg/dL  Hemoglobin A1c     Status: Abnormal   Collection Time: 02/06/19  4:30 AM  Result Value Ref Range   Hgb A1c MFr Bld 6.6 (H) 4.8 - 5.6 %   Mean Plasma Glucose 142.72 mg/dL  HIV Antibody (routine testing w rflx)     Status: None   Collection Time: 02/06/19  4:30 AM  Result Value Ref Range   HIV Screen 4th Generation wRfx NON REACTIVE NON REACTIVE  Magnesium     Status: None   Collection Time: 02/06/19  4:30 AM  Result Value Ref Range   Magnesium 1.8 1.7 - 2.4 mg/dL  CBC     Status: Abnormal   Collection Time: 02/06/19  4:30 AM  Result Value Ref Range   WBC 10.4 4.0 - 10.5 K/uL   RBC 4.98 3.87 - 5.11 MIL/uL   Hemoglobin 14.8 12.0 - 15.0 g/dL   HCT 46.9 (H) 36.0 - 46.0 %   MCV 94.2 80.0 - 100.0 fL   MCH 29.7 26.0 - 34.0 pg   MCHC 31.6 30.0 - 36.0 g/dL   RDW 15.3 11.5 - 15.5 %   Platelets 213 150 - 400 K/uL   nRBC 0.0 0.0 - 0.2 %  Lactic acid, plasma     Status: Abnormal   Collection Time: 02/06/19  4:30 AM  Result  Value Ref Range   Lactic Acid, Venous 2.5 (HH) 0.5 - 1.9 mmol/L  TSH     Status: None   Collection Time: 02/06/19  4:30 AM  Result Value Ref Range   TSH 0.599 0.350 - 4.500 uIU/mL  Basic metabolic panel     Status: Abnormal   Collection Time: 02/06/19  4:30 AM  Result Value Ref Range   Sodium 141 135 - 145 mmol/L   Potassium 2.9 (L) 3.5 - 5.1 mmol/L   Chloride 102 98 - 111 mmol/L   CO2 25 22 - 32 mmol/L   Glucose, Bld 94 70 - 99 mg/dL   BUN 12 8 - 23 mg/dL   Creatinine, Ser 1.03 (H) 0.44 - 1.00 mg/dL   Calcium  8.2 (L) 8.9 - 10.3 mg/dL   GFR calc non Af Amer 55 (L) >60 mL/min   GFR calc Af Amer >60 >60 mL/min   Anion gap 14 5 - 15  D-dimer, quantitative (not at [facility])     Status: Abnormal   Collection Time: 02/06/19  4:30 AM  Result Value Ref Range   D-Dimer, Quant 1.99 (H) 0.00 - 0.50 ug/mL-FEU  Glucose, capillary     Status: None   Collection Time: 02/06/19  8:11 AM  Result Value Ref Range   Glucose-Capillary 91 70 - 99 mg/dL  CBC     Status: None   Collection Time: 02/06/19  9:08 AM  Result Value Ref Range   WBC 8.8 4.0 - 10.5 K/uL   RBC 4.55 3.87 - 5.11 MIL/uL   Hemoglobin 13.6 12.0 - 15.0 g/dL   HCT 42.7 36.0 - 46.0 %   MCV 93.8 80.0 - 100.0 fL   MCH 29.9 26.0 - 34.0 pg   MCHC 31.9 30.0 - 36.0 g/dL   RDW 15.3 11.5 - 15.5 %   Platelets 200 150 - 400 K/uL   nRBC 0.0 0.0 - 0.2 %  Lactic acid, plasma     Status: None   Collection Time: 02/06/19  9:08 AM  Result Value Ref Range   Lactic Acid, Venous 1.0 0.5 - 1.9 mmol/L  Glucose, capillary     Status: Abnormal   Collection Time: 02/06/19 11:21 AM  Result Value Ref Range   Glucose-Capillary 105 (H) 70 - 99 mg/dL     CT ABDOMEN PELVIS W CONTRAST  Result Date: 02/05/2019 CLINICAL DATA:  Nausea vomiting diarrhea EXAM: CT ABDOMEN AND PELVIS WITH CONTRAST TECHNIQUE: Multidetector CT imaging of the abdomen and pelvis was performed using the standard protocol following bolus administration of intravenous contrast.  CONTRAST:  159m OMNIPAQUE IOHEXOL 300 MG/ML  SOLN COMPARISON:  None. FINDINGS: Lower chest: Lung bases demonstrate no acute consolidation or pleural effusion heart size within normal limits. Hepatobiliary: No focal hepatic abnormality. No calcified gallstone or biliary dilatation Pancreas: No inflammatory change.  Mildly prominent pancreatic duct Spleen: Normal in size without focal abnormality. Adrenals/Urinary Tract: Adrenal glands are unremarkable. Kidneys are normal, without renal calculi, focal lesion, or hydronephrosis. Bladder is unremarkable. Stomach/Bowel: The stomach is nonenlarged. No dilated small bowel. The appendix is not well seen but no right lower quadrant inflammatory process. Prominent wall thickening and inflammatory change extending from the distal transverse colon to the rectosigmoid colon. Vascular/Lymphatic: Aorta is non aneurysmal. Mild aortic atherosclerosis. No significant adenopathy. Reproductive: Status post hysterectomy. No adnexal masses. Other: No free air.  Trace fluid in the left colic gutter. Musculoskeletal: 8 mm anterolisthesis L4 on L5. Advanced degenerative changes L4-L5 and L5-S1. IMPRESSION: 1. Prominent wall thickening and associated inflammatory changes extending from the distal transverse colon to the rectosigmoid colon, consistent with colitis of infectious, inflammatory, or ischemic etiology. No perforation or free air identified. Electronically Signed   By: KDonavan FoilM.D.   On: 02/05/2019 20:29   DG Chest Port 1 View  Result Date: 02/05/2019 CLINICAL DATA:  Former smoker; diarrhea; elevated WBC; FUO; h/o bronchitisfever EXAM: PORTABLE CHEST 1 VIEW COMPARISON:  Radiograph 02/19/2014 FINDINGS: Normal mediastinum and cardiac silhouette. Normal pulmonary vasculature. No evidence of effusion, infiltrate, or pneumothorax. No acute bony abnormality. IMPRESSION: Normal chest radiograph Electronically Signed   By: SSuzy BouchardM.D.   On: 02/05/2019 20:16   VAS [place] LOWER EXTREMITY VENOUS (DVT)  Result Date: 02/06/2019  Lower Venous  Study Indications: Swelling.  Limitations: Body habitus and poor ultrasound/tissue interface. Comparison Study: No prior study. Performing Technologist: Maudry Mayhew MHA, RDMS, RVT, RDCS  Examination Guidelines: A complete evaluation includes B-mode imaging, spectral Doppler, color Doppler, and power Doppler as needed of all accessible portions of each vessel. Bilateral testing is considered an integral part of a complete examination. Limited examinations for reoccurring indications may be performed as noted.  +-----+---------------+---------+-----------+----------+--------------+ RIGHTCompressibilityPhasicitySpontaneityPropertiesThrombus Aging +-----+---------------+---------+-----------+----------+--------------+ CFV  Full           Yes      Yes                                 +-----+---------------+---------+-----------+----------+--------------+   +---------+---------------+---------+-----------+----------+--------------+ LEFT     CompressibilityPhasicitySpontaneityPropertiesThrombus Aging +---------+---------------+---------+-----------+----------+--------------+ CFV      Full           Yes      Yes                                 +---------+---------------+---------+-----------+----------+--------------+ SFJ      Full                                                        +---------+---------------+---------+-----------+----------+--------------+ FV Prox  Full                                                        +---------+---------------+---------+-----------+----------+--------------+ FV Mid   Full                                                        +---------+---------------+---------+-----------+----------+--------------+ FV DistalFull                                                        +---------+---------------+---------+-----------+----------+--------------+ PFV       Full                                                        +---------+---------------+---------+-----------+----------+--------------+ POP      Full           Yes      Yes                                 +---------+---------------+---------+-----------+----------+--------------+ PTV      Full                                                        +---------+---------------+---------+-----------+----------+--------------+  PERO     Full                                                        +---------+---------------+---------+-----------+----------+--------------+ Limited visualization of left posterior tibial and peroneal veins.    Summary: Right: No evidence of common femoral vein obstruction. Left: There is no evidence of deep vein thrombosis in the lower extremity. However, portions of this examination were limited- see technologist comments above. No cystic structure found in the popliteal fossa.  *See table(s) above for measurements and observations. Electronically signed by Ruta Hinds MD on 02/06/2019 at 1:49:58 PM.    Final     ROS:  As stated above in the HPI otherwise negative.  Blood pressure (!) 104/58, pulse 87, temperature 99.6 F (37.6 C), temperature source Oral, resp. rate 17, height 5' 1"  (1.549 m), weight 61.7 kg, SpO2 96 %.    PE: Gen: NAD, Alert and Oriented HEENT:  [place]/AT, EOMI Neck: Supple, no LAD Lungs: CTA Bilaterally CV: RRR without M/G/R ABM: Soft, tenderness on the left side of the abdomen, +BS Ext: No C/C/E  Assessment/Plan: 1) Left sided colitis. 2) Blood diarrhea. 3) Abdominal pain.   Her current presentation can be consistent with an ischemic colitis or infectious colitis.  IBD is always a consideration, but it seems to be less likely.  She appears to be responding to the antibiotic treatment and she will need a follow up colonoscopy as an outpatient.  Clinically she is improving and she reports that her left sided pain is  decreasing.  Plan: 1) Continue with the current treatment. 2) Upon discharge change the CTX to Augmentin and continue with Flagyl.  A 10 day course should suffice. 3) Follow up with Dr. Collene Mares in 1-2 weeks.  She will most likely require an outpatient colonoscopy.  Shonna Deiter D 02/06/2019, 2:59 PM

## 2019-02-06 NOTE — Progress Notes (Signed)
Report given to Lucas on 5E. Pt to transfer to unit soon.

## 2019-02-06 NOTE — Plan of Care (Signed)
Plan of care discussed.   

## 2019-02-06 NOTE — Progress Notes (Signed)
Pt transferred to 5E via bed accompanied by NT x2.

## 2019-02-06 NOTE — ED Notes (Signed)
Contacted EMS for transport to [facility] ([facility] had only one truck running)

## 2019-02-06 NOTE — H&P (Signed)
History and Physical    Madison Ball KZL:[id number] DOB: Mar 14, 1950 DOA: 02/05/2019  PCP: Lucianne Lei, MD Patient coming from: [facility]  Chief Complaint: Abdominal pain, vomiting, bloody diarrhea  HPI: Madison Ball is a 68 y.o. female with medical history significant of asthma, type 2 diabetes, hyperlipidemia, eczema presented to the ED with complains of abdominal pain, vomiting, and bloody diarrhea.  Patient states about 24 hours ago she started having cramping left upper and lower quadrant abdominal pain and started vomiting.  She has also had several episodes of diarrhea since then.  The diarrhea was initially nonbloody but later she had a few episodes of just bright red blood per rectum.  She takes aspirin 325 mg daily and no other blood thinners.  Denies over-the-counter NSAID use.  States she was told she had fevers while in the emergency room.  Denies shortness of breath or chest pain.  Patient states she has not had any further episodes of bloody diarrhea since she has been in the ED.  ED Course: Febrile with T-max 100.5 F.  Not tachycardic.  Blood pressure intermittently soft.  FOBT positive.  Bright red blood noted on digital rectal exam done in the ED.  WBC count 12.5.  Lactic acid 2.6 >2.0 with IV fluid.  Hemoglobin 17.2.  Potassium 2.7.  Chloride 94.  BUN 17, creatinine 1.1.  Renal function at baseline.  T bili 1.4, remainder of LFTs normal.  Lipase normal.  SARS-CoV-2 antigen test negative, PCR test pending.  UA not suggestive of infection.  Blood culture x2 pending.  Chest x-ray showing no active disease.  CT abdomen pelvis with contrast showing prominent wall thickening and associated inflammatory changes extending from the distal transverse colon to the rectosigmoid colon, consistent with colitis of infectious, inflammatory, or ischemic etiology.  No perforation or free air identified.  Patient received Tylenol, morphine, Zofran, oral potassium 40 mEq, ceftriaxone, Flagyl, and 2.5 L  normal saline boluses.  Review of Systems:  All systems reviewed and apart from history of presenting illness, are negative.  Past Medical History:  Diagnosis Date  . Asthma   . Blood clot in vein   . Diabetes insipidus ([place])   . Eczema   . Hypercholesterolemia   . Recurrent upper respiratory infection (URI)     Past Surgical History:  Procedure Laterality Date  . ANKLE SURGERY Right   . TOTAL ABDOMINAL HYSTERECTOMY  1990     reports that she quit smoking about 49 years ago. Her smoking use included cigarettes. She has a 1.50 pack-year smoking history. She has never used smokeless tobacco. She reports that she does not drink alcohol or use drugs.  Allergies  Allergen Reactions  . Emetine   . Erythromycin   . Reglan [Metoclopramide]   . Chlordiazepoxide Hcl Rash  . Librium [Chlordiazepoxide] Rash    Family History  Problem Relation Age of Onset  . Breast cancer Mother   . Asthma Mother   . Bronchitis Mother   . Heart disease Father     Prior to Admission medications   Medication Sig Start Date End Date Taking? Authorizing Provider  albuterol (PROAIR HFA) 108 (90 Base) MCG/ACT inhaler Inhale 2 puffs into the lungs every 4 (four) hours as needed for wheezing or shortness of breath. 02/20/18   Bobbitt, Sedalia Muta, MD  azelastine (ASTELIN) 0.1 % nasal spray Place 1-2 sprays into both nostrils 2 (two) times daily as needed for rhinitis. 11/13/18   Bobbitt, Sedalia Muta, MD  DULERA 200-5  MCG/ACT AERO TAKE 2 PUFFS BY MOUTH TWICE A DAY 12/11/18   Bobbitt, Sedalia Muta, MD  famotidine (PEPCID) 20 MG tablet Take 1 tablet (20 mg total) by mouth 2 (two) times daily. 02/20/18   Bobbitt, Sedalia Muta, MD  fluticasone (FLONASE) 50 MCG/ACT nasal spray Place 2 sprays into both nostrils daily. 02/20/18   Bobbitt, Sedalia Muta, MD  JARDIANCE 10 MG TABS tablet  01/10/18   [provider]  MAXZIDE-25 37.5-25 MG per tablet Take 1 tablet by mouth Daily. 10/19/10   [provider]    metFORMIN (GLUCOPHAGE) 500 MG tablet Take 1 tablet by mouth Daily. 10/13/10   [provider]  mometasone-formoterol (DULERA) 100-5 MCG/ACT AERO Inhale 2 puffs into the lungs 2 (two) times daily. 08/15/17   Bobbitt, Sedalia Muta, MD  montelukast (SINGULAIR) 10 MG tablet Take 1 tablet (10 mg total) by mouth at bedtime. 11/28/18   Bobbitt, Sedalia Muta, MD  omeprazole (PRILOSEC) 20 MG capsule Take 1 capsule (20 mg total) by mouth daily. 02/20/18   Bobbitt, Sedalia Muta, MD  predniSONE (DELTASONE) 10 MG tablet Take 40 mg x 4 days then 20 mg x 2 days then 10 mg x 1 day Patient not taking: Reported on 11/13/2018 02/20/18   Bobbitt, Sedalia Muta, MD  ranitidine (ZANTAC) 300 MG capsule Take 1 capsule (300 mg total) by mouth every evening. Patient not taking: Reported on 11/13/2018 11/01/16   Bobbitt, Sedalia Muta, MD  Vitamin E 400 UNITS TABS Take 2 tablets by mouth daily.      [provider]  VYTORIN 10-40 MG per tablet Take 1 tablet by mouth Daily. 10/13/10   [provider]    Physical Exam: Vitals:   02/06/19 0236 02/06/19 0252 02/06/19 0254 02/06/19 0338  BP:  112/68  96/68  Pulse:  92  91  Resp:  18  20  Temp:  (!) 100.5 F (38.1 C) 99.3 F (37.4 C)   TempSrc:  Oral Oral Oral  SpO2:  94%    Weight: 61.7 kg     Height: 5' 1"  (1.549 m)       Physical Exam  Constitutional: She is oriented to person, place, and time. She appears well-developed and well-nourished. No distress.  HENT:  Head: Normocephalic.  Eyes: Right eye exhibits no discharge. Left eye exhibits no discharge.  Cardiovascular: Normal rate, regular rhythm and intact distal pulses.  Pulmonary/Chest: Effort normal and breath sounds normal. No respiratory distress. She has no wheezes. She has no rales.  Abdominal: Soft. Bowel sounds are normal. She exhibits no distension. There is abdominal tenderness. There is guarding. There is no rebound.  Left upper quadrant tender to palpation with guarding   Musculoskeletal:        General: Edema present.     Cervical back: Neck supple.     Comments: No edema of right lower extremity Left lower extremity appears erythematous (chronic per patient)  Neurological: She is alert and oriented to person, place, and time.  Skin: Skin is warm and dry. She is not diaphoretic.     Labs on Admission: I have personally reviewed following labs and imaging studies  CBC: Recent Labs  Lab 02/05/19 1842  WBC 12.5*  NEUTROABS 9.3*  HGB 17.2*  HCT 51.8*  MCV 89.9  PLT 841   Basic Metabolic Panel: Recent Labs  Lab 02/05/19 1842  NA 140  K 2.7*  CL 94*  CO2 30  GLUCOSE 116*  BUN 17  CREATININE 1.13*  CALCIUM 9.6  GFR: Estimated Creatinine Clearance: 39.1 mL/min (A) (by C-G formula based on SCr of 1.13 mg/dL (H)). Liver Function Tests: Recent Labs  Lab 02/05/19 1842  AST 36  ALT 27  ALKPHOS 56  BILITOT 1.4*  PROT 7.7  ALBUMIN 4.1   Recent Labs  Lab 02/05/19 1842  LIPASE 28   No results for input(s): AMMONIA in the last 168 hours. Coagulation Profile: No results for input(s): INR, PROTIME in the last 168 hours. Cardiac Enzymes: No results for input(s): CKTOTAL, CKMB, CKMBINDEX, TROPONINI in the last 168 hours. BNP (last 3 results) No results for input(s): PROBNP in the last 8760 hours. HbA1C: No results for input(s): HGBA1C in the last 72 hours. CBG: No results for input(s): GLUCAP in the last 168 hours. Lipid Profile: No results for input(s): CHOL, HDL, LDLCALC, TRIG, CHOLHDL, LDLDIRECT in the last 72 hours. Thyroid Function Tests: No results for input(s): TSH, T4TOTAL, FREET4, T3FREE, THYROIDAB in the last 72 hours. Anemia Panel: No results for input(s): VITAMINB12, FOLATE, FERRITIN, TIBC, IRON, RETICCTPCT in the last 72 hours. Urine analysis:    Component Value Date/Time   COLORURINE YELLOW 02/05/2019 2249   APPEARANCEUR CLEAR 02/05/2019 2249   LABSPEC 1.010 02/05/2019 2249   PHURINE 5.5 02/05/2019 2249    GLUCOSEU >=500 (A) 02/05/2019 2249   HGBUR NEGATIVE 02/05/2019 2249   BILIRUBINUR NEGATIVE 02/05/2019 2249   KETONESUR NEGATIVE 02/05/2019 2249   PROTEINUR NEGATIVE 02/05/2019 2249   NITRITE NEGATIVE 02/05/2019 2249   LEUKOCYTESUR NEGATIVE 02/05/2019 2249    Radiological Exams on Admission: CT ABDOMEN PELVIS W CONTRAST  Result Date: 02/05/2019 CLINICAL DATA:  Nausea vomiting diarrhea EXAM: CT ABDOMEN AND PELVIS WITH CONTRAST TECHNIQUE: Multidetector CT imaging of the abdomen and pelvis was performed using the standard protocol following bolus administration of intravenous contrast. CONTRAST:  154m OMNIPAQUE IOHEXOL 300 MG/ML  SOLN COMPARISON:  None. FINDINGS: Lower chest: Lung bases demonstrate no acute consolidation or pleural effusion heart size within normal limits. Hepatobiliary: No focal hepatic abnormality. No calcified gallstone or biliary dilatation Pancreas: No inflammatory change.  Mildly prominent pancreatic duct Spleen: Normal in size without focal abnormality. Adrenals/Urinary Tract: Adrenal glands are unremarkable. Kidneys are normal, without renal calculi, focal lesion, or hydronephrosis. Bladder is unremarkable. Stomach/Bowel: The stomach is nonenlarged. No dilated small bowel. The appendix is not well seen but no right lower quadrant inflammatory process. Prominent wall thickening and inflammatory change extending from the distal transverse colon to the rectosigmoid colon. Vascular/Lymphatic: Aorta is non aneurysmal. Mild aortic atherosclerosis. No significant adenopathy. Reproductive: Status post hysterectomy. No adnexal masses. Other: No free air.  Trace fluid in the left colic gutter. Musculoskeletal: 8 mm anterolisthesis L4 on L5. Advanced degenerative changes L4-L5 and L5-S1. IMPRESSION: 1. Prominent wall thickening and associated inflammatory changes extending from the distal transverse colon to the rectosigmoid colon, consistent with colitis of infectious, inflammatory, or  ischemic etiology. No perforation or free air identified. Electronically Signed   By: KDonavan FoilM.D.   On: 02/05/2019 20:29   DG Chest Port 1 View  Result Date: 02/05/2019 CLINICAL DATA:  Former smoker; diarrhea; elevated WBC; FUO; h/o bronchitisfever EXAM: PORTABLE CHEST 1 VIEW COMPARISON:  Radiograph 02/19/2014 FINDINGS: Normal mediastinum and cardiac silhouette. Normal pulmonary vasculature. No evidence of effusion, infiltrate, or pneumothorax. No acute bony abnormality. IMPRESSION: Normal chest radiograph Electronically Signed   By: SSuzy BouchardM.D.   On: 02/05/2019 20:16    EKG: Independently reviewed.  Sinus rhythm, PR prolongation, LBBB.  No prior EKG for comparison.  Assessment/Plan Principal Problem:   Colitis Active Problems:   Acute lower GI bleeding   Sepsis ([place])   Hypokalemia   Type 2 diabetes mellitus ([facility])   Acute lower GI bleed and sepsis secondary to colitis Patient is presenting with complaints of cramping left upper and lower quadrant abdominal pain, vomiting, and bloody diarrhea/bright red blood per rectum.  Takes aspirin 325 mg at home daily.  Febrile with T-max 100.5 F.  Not tachycardic.  Blood pressure currently soft with systolic in the 35W.  FOBT positive.  Bright red blood noted on digital rectal exam done in the ED.  WBC count 12.5.  Lactic acid 2.6 >2.0 with IV fluid.  Hemoglobin 17.2 on labs initially done in the ED.  No further episodes of bloody diarrhea since patient has been in the ED. CT abdomen pelvis with contrast showing prominent wall thickening and associated inflammatory changes extending from the distal transverse colon to the rectosigmoid colon, consistent with colitis of infectious, inflammatory, or ischemic etiology.  No perforation or free air identified. -Bowel rest, keep n.p.o. -Continue IV fluid and continue to monitor blood pressure closely -Ceftriaxone and Flagyl -Morphine as needed for pain -Zofran as needed for  nausea/vomiting -C. difficile PCR and GI pathogen panel -Serial CBCs -Trend lactate -Blood culture x2 pending -Consult GI in a.m.  Hypokalemia Likely related to poor oral intake and GI losses from vomiting/diarrhea.  Potassium 2.7.  EKG with PR prolongation, no prior EKG for comparison. -Cardiac monitoring -Potassium supplementation.  Check magnesium level and replete if low.  Continue to monitor electrolytes closely. -Repeat EKG in a.m.  Lymphedema of left lower extremity Patient noted to have nonpitting edema of the left lower extremity and no edema of the right lower extremity.  She tells me this is chronic and she has previously been told she has lymphedema.  Denies any pain in this extremity. -Check D-dimer level.  If elevated, left lower extremity Doppler to rule out DVT  Non-insulin-dependent diabetes -Check A1c.  Sliding scale insulin every 4 hours as patient is currently n.p.o. and CBG checks.  PR prolongation on EKG -Cardiac monitoring -Monitor potassium level -Check TSH level  Asthma -Stable.  No bronchospasm.  Albuterol as needed.  Pharmacy med rec pending.  DVT prophylaxis: SCDs Code Status: Patient wishes to be full code. Family Communication: No family available at this time. Disposition Plan: Anticipate discharge after clinical improvement. Consults called: None Admission status: It is my clinical opinion that admission to INPATIENT is reasonable and necessary in this 68 y.o. female . presenting with acute lower GI bleed and sepsis secondary to colitis.  High risk of decompensation.  Needs evaluation by gastroenterology in the morning.  Given the aforementioned, the predictability of an adverse outcome is felt to be significant. I expect that the patient will require at least 2 midnights in the hospital to treat this condition.   The medical decision making on this patient was of high complexity and the patient is at high risk for clinical deterioration,  therefore this is a level 3 visit.  Shela Leff MD [facility] Pager [phone]  If 7PM-7AM, please contact night-coverage [email or web address] Password [email or web address]  02/06/2019, 4:07 AM

## 2019-02-06 NOTE — Progress Notes (Signed)
Left lower extremity venous duplex completed. Refer to "CV Proc" under chart review to view preliminary results.  02/06/2019 9:52 AM Kelby Aline., MHA, RVT, RDCS, RDMS

## 2019-02-06 NOTE — Progress Notes (Signed)
Patient seen and examined, admitted earlier this morning by Dr. Marlowe Sax, please see her H&P for details, briefly this is a 68 year old female with history of asthma, type 2 diabetes mellitus, dyslipidemia, eczema presented to the ED with acute onset left-sided abdominal pain vomiting and diarrhea that started yesterday, the diarrhea was initially nonbloody and then subsequently a few episodes of bright red blood per rectum were also noted. -No recent antibiotics, no blood thinners except for aspirin, no NSAID use -In the emergency room she was noted to have a low-grade temp of 100.5, blood pressure was soft, bright red blood was noted on digital rectal exam, white count was 12.5, lactic acid was mildly elevated at 2.6 which improved with fluids, CT abdomen pelvis noted left-sided colitis involving distal transverse and rectosigmoid.  1.  Left-sided colitis -Etiology is unclear, ischemic colitis is a possibility, versus infectious, no history of IBD -Last colonoscopy was approximately 5 years ago per Dr. Collene Mares per patient report which was allegedly unremarkable -Continue IV fluids today, continue ceftriaxone and Flagyl -Start clear liquids -Follow-up C. difficile PCR GI pathogen panel -GI consult requested, send message to Dr. Benson Norway  2.  Hypokalemia -Replaced  3. Type 2 diabetes mellitus -Hold Metformin and Jardiance, sliding scale insulin for now  4.  Asthma -Stable, albuterol PRN  Domenic Polite, MD

## 2019-02-07 LAB — BASIC METABOLIC PANEL
Anion gap: 10 (ref 5–15)
BUN: 10 mg/dL (ref 8–23)
CO2: 23 mmol/L (ref 22–32)
Calcium: 8.7 mg/dL — ABNORMAL LOW (ref 8.9–10.3)
Chloride: 103 mmol/L (ref 98–111)
Creatinine, Ser: 1.04 mg/dL — ABNORMAL HIGH (ref 0.44–1.00)
GFR calc Af Amer: >60 mL/min (ref >60)
GFR calc non Af Amer: 54 mL/min — ABNORMAL LOW (ref >60)
Glucose, Bld: 102 mg/dL — ABNORMAL HIGH (ref 70–99)
Potassium: 3.9 mmol/L (ref 3.5–5.1)
Sodium: 136 mmol/L (ref 135–145)

## 2019-02-07 LAB — GLUCOSE, CAPILLARY
Glucose-Capillary: 80 mg/dL (ref 70–99)
Glucose-Capillary: 87 mg/dL (ref 70–99)
Glucose-Capillary: 91 mg/dL (ref 70–99)
Glucose-Capillary: 76 mg/dL (ref 70–99)
Glucose-Capillary: 80 mg/dL (ref 70–99)

## 2019-02-07 LAB — URINE CULTURE: Culture: NO GROWTH

## 2019-02-07 MED ORDER — GUAIFENESIN-DM 100-10 MG/5ML PO SYRP
5.0000 mL | ORAL_SOLUTION | ORAL | Status: DC | PRN
  Administered 2019-02-07 – 2019-02-10 (×4): 5 mL via ORAL
  Filled 2019-02-07 (×4): qty 10

## 2019-02-07 NOTE — Progress Notes (Signed)
PROGRESS NOTE    Madison Ball  VXB:[id number] DOB: 10/21/1950 DOA: 02/05/2019 PCP: Lucianne Lei, MD   Brief Narrative:  Patient Is a 68 year old female with past medical history of bronchitis, type 2 diabetes mellitus, dyslipidemia, eczema who presented to the emergency department with acute onset of left-sided abdominal pain, vomiting, diarrhea.  Diarrhea was reportedly bloody.  No recent use of antibiotics or blood thinners except for aspirin.  In the emergency department she was found to have low-grade temperature, hypotension.  Bright red blood was noted on digital rectal exam.  CT abdomen/pelvis showed left-sided colitis involving the distal transverse and rectosigmoid colon.  GI was consulted.  Recommended to continue antibiotics and follow-up as an outpatient. This morning her abdominal pain is slightly better.  Plan is to advance the diet to full liquid.  Assessment & Plan:   Principal Problem:   Colitis Active Problems:   Acute lower GI bleeding   Sepsis ([place])   Hypokalemia   Type 2 diabetes mellitus (HCC)   Left-sided colitis: Unclear etiology.  Ischemic colitis versus infectious versus inflammatory bowel disease.  Last colonoscopy about 6 years ago by Dr. Collene Mares. Started on antibiotics.  C. difficile negative.  GI pathogen panel pending.  On ceftriaxone and Flagyl for now.  Pain is better today.  Tolerating clear liquid diet.  Will advance diet to full liquid. GI was consulted.  Recommend to continue antibiotics and follow-up as outpatient.  Suspected sepsis: Was hypotensive, febrile, lactic acidosis and presentation.  She still has mild grade fever today.  Continue current antibiotics.  Cultures have been sent, no growth till date.  Hypokalemia: Supplemented and corrected  Diabetes mellitus type 2: Continue sliding scale insulin for now.  On Metformin and Jardiance at home.  Hemoglobin A1c of 6.6.  Asthma/bronchitis: Follows with pulmonology as an outpatient.  On Dulera  at home.  Currently stable.         DVT prophylaxis:SCD s Code Status: Full Family Communication: None present at bedside Disposition Plan: Home after improvement in the abdominal pain, tolerance of diet  Consultants: GI  Procedures: None  Antimicrobials:  Anti-infectives (From admission, onward)   Start     Dose/Rate Route Frequency Ordered Stop   02/06/19 2000  cefTRIAXone (ROCEPHIN) 2 g in sodium chloride 0.9 % 100 mL IVPB     2 g 200 mL/hr over 30 Minutes Intravenous Every 24 hours 02/06/19 0327     02/06/19 0345  metroNIDAZOLE (FLAGYL) IVPB 500 mg     500 mg 100 mL/hr over 60 Minutes Intravenous Every 8 hours 02/06/19 0327     02/05/19 2000  cefTRIAXone (ROCEPHIN) 2 g in sodium chloride 0.9 % 100 mL IVPB     2 g 200 mL/hr over 30 Minutes Intravenous  Once 02/05/19 1945 02/05/19 2121   02/05/19 2000  metroNIDAZOLE (FLAGYL) IVPB 500 mg     500 mg 100 mL/hr over 60 Minutes Intravenous  Once 02/05/19 1945 02/05/19 2133      Subjective:  Patient seen and examined at bedside this morning.  Hemodynamically stable.  She looks better today.  Her abdomen pain is better.  She had to loose stools this morning which were slightly bloody .  Eager to try full liquid diet today.   Objective: Vitals:   02/06/19 1714 02/06/19 2116 02/06/19 2147 02/07/19 0356  BP: 107/72 117/74  102/69  Pulse: 97 (!) 102  (!) 102  Resp: _0 Temp: 99.8 F (37.7 C) (!) 100.4 F (  38 C)  100.3 F (37.9 C)  TempSrc: Oral     SpO2: 92% (!) 88% 93% 94%  Weight:      Height:        Intake/Output Summary (Last 24 hours) at 02/07/2019 1117 Last data filed at 02/07/2019 0600 Gross per 24 hour  Intake 780.06 ml  Output --  Net 780.06 ml   Filed Weights   02/05/19 1642 02/06/19 0236  Weight: 62.1 kg 61.7 kg    Examination:  General exam: Appears calm and comfortable ,Not in distress,average built HEENT:PERRL,Oral mucosa moist, Ear/Nose normal on gross exam Respiratory system:  Bilateral equal air entry, normal vesicular breath sounds, no wheezes or crackles  Cardiovascular system: S1 & S2 heard, RRR. No JVD, murmurs, rubs, gallops or clicks. No pedal edema. Gastrointestinal system: Abdomen is nondistended, soft . No organomegaly or masses felt. Normal bowel sounds heard.  Tenderness in the left lower quadrant Central nervous system: Alert and oriented. No focal neurological deficits. Extremities: No edema, no clubbing ,no cyanosis, distal peripheral pulses palpable. Skin: No rashes, lesions or ulcers,no icterus ,no pallor    Data Reviewed: I have personally reviewed following labs and imaging studies  CBC: Recent Labs  Lab 02/05/19 1842 02/06/19 0430 02/06/19 0908  WBC 12.5* 10.4 8.8  NEUTROABS 9.3*  --   --   HGB 17.2* 14.8 13.6  HCT 51.8* 46.9* 42.7  MCV 89.9 94.2 93.8  PLT 239 213 710   Basic Metabolic Panel: Recent Labs  Lab 02/05/19 1842 02/06/19 0430 02/07/19 0839  NA 140 141 136  K 2.7* 2.9* 3.9  CL 94* 102 103  CO2 _0 GLUCOSE 116* 94 102*  BUN _1 CREATININE 1.13* 1.03* 1.04*  CALCIUM 9.6 8.2* 8.7*  MG  --  1.8  --    GFR: Estimated Creatinine Clearance: 42.4 mL/min (A) (by C-G formula based on SCr of 1.04 mg/dL (H)). Liver Function Tests: Recent Labs  Lab 02/05/19 1842  AST 36  ALT 27  ALKPHOS 56  BILITOT 1.4*  PROT 7.7  ALBUMIN 4.1   Recent Labs  Lab 02/05/19 1842  LIPASE 28   No results for input(s): AMMONIA in the last 168 hours. Coagulation Profile: No results for input(s): INR, PROTIME in the last 168 hours. Cardiac Enzymes: No results for input(s): CKTOTAL, CKMB, CKMBINDEX, TROPONINI in the last 168 hours. BNP (last 3 results) No results for input(s): PROBNP in the last 8760 hours. HbA1C: Recent Labs    02/06/19 0430  HGBA1C 6.6*   CBG: Recent Labs  Lab 02/06/19 1605 02/06/19 2107 02/06/19 2345 02/07/19 0346 02/07/19 0801  GLUCAP 102* 76 72 80 87   Lipid Profile: No results for  input(s): CHOL, HDL, LDLCALC, TRIG, CHOLHDL, LDLDIRECT in the last 72 hours. Thyroid Function Tests: Recent Labs    02/06/19 0430  TSH 0.599   Anemia Panel: No results for input(s): VITAMINB12, FOLATE, FERRITIN, TIBC, IRON, RETICCTPCT in the last 72 hours. Sepsis Labs: Recent Labs  Lab 02/05/19 1842 02/05/19 2037 02/06/19 0430 02/06/19 0908  LATICACIDVEN 2.6* 2.0* 2.5* 1.0    Recent Results (from the past 240 hour(s))  Blood culture (routine x 2)     Status: None (Preliminary result)   Collection Time: 02/05/19  6:40 PM   Specimen: BLOOD  Result Value Ref Range Status   Specimen Description   Final    BLOOD LEFT ANTECUBITAL Performed at [facility], [address] [place], [place] [address]  Special Requests   Final    BOTTLES DRAWN AEROBIC AND ANAEROBIC Blood Culture adequate volume Performed at [facility], [address] [place], [place] [address]    Culture   Final    NO GROWTH < 24 HOURS Performed at [facility], [place] [address], [place], [place] [address]    Report Status PENDING  Incomplete  SARS Coronavirus 2 Ag (30 min TAT) - Nasal Swab (BD Veritor Kit)     Status: None   Collection Time: 02/05/19  6:42 PM   Specimen: Nasal Swab (BD Veritor Kit)  Result Value Ref Range Status   SARS Coronavirus 2 Ag NEGATIVE NEGATIVE Final    Comment: (NOTE) SARS-CoV-2 antigen NOT DETECTED.  Negative results are presumptive.  Negative results do not preclude SARS-CoV-2 infection and should not be used as the sole basis for treatment or other patient management decisions, including infection  control decisions, particularly in the presence of clinical signs and  symptoms consistent with COVID-19, or in those who have been in contact with the virus.  Negative results must be combined with clinical observations, patient history, and epidemiological information. The expected result is Negative. Fact Sheet for Patients:  PodPark.tn Fact Sheet for Healthcare Providers: GiftContent.is This test is not yet approved or cleared by the [place] FDA and  has been authorized for detection and/or diagnosis of SARS-CoV-2 by FDA under an Emergency Use Authorization (EUA).  This EUA will remain in effect (meaning this test can be used) for the duration of  the COVID-19 de claration under Section 564(b)(1) of the Act, 21 U.S.C. section 360bbb-3(b)(1), unless the authorization is terminated or revoked sooner. Performed at [facility], [address] [place], [place] [address]   Blood culture (routine x 2)     Status: None (Preliminary result)   Collection Time: 02/05/19  6:42 PM   Specimen: BLOOD  Result Value Ref Range Status   Specimen Description   Final    BLOOD RIGHT ANTECUBITAL Performed at [facility], [address] [place], [place] [address]    Special Requests   Final    BOTTLES DRAWN AEROBIC AND ANAEROBIC Blood Culture adequate volume Performed at [facility], [address] [place], [place] [address]    Culture   Final    NO GROWTH < 24 HOURS Performed at [facility], [place] [address] [place], [place] [address]    Report Status PENDING  Incomplete  SARS CORONAVIRUS 2 (TAT 6-24 HRS) Nasopharyngeal Nasopharyngeal Swab     Status: None   Collection Time: 02/05/19  9:08 PM   Specimen: Nasopharyngeal Swab  Result Value Ref Range Status   SARS Coronavirus 2 NEGATIVE NEGATIVE Final    Comment: (NOTE) SARS-CoV-2 target nucleic acids are NOT DETECTED. The SARS-CoV-2 RNA is generally detectable in upper and lower respiratory specimens during the acute phase of infection. Negative results do not preclude SARS-CoV-2 infection, do not rule out co-infections with other pathogens, and should not be used as the sole basis for treatment or other patient management decisions. Negative results must be  combined with clinical observations, patient history, and epidemiological information. The expected result is Negative. Fact Sheet for Patients: SugarRoll.be Fact Sheet for Healthcare Providers: https://www.woods-mathews.com/ This test is not yet approved or cleared by the [place] FDA and  has been authorized for detection and/or diagnosis of SARS-CoV-2 by FDA under an Emergency Use Authorization (EUA). This EUA  will remain  in effect (meaning this test can be used) for the duration of the COVID-19 declaration under Section 56 4(b)(1) of the Act, 21 U.S.C. section 360bbb-3(b)(1), unless the authorization is terminated or revoked sooner. Performed at [facility], [place] [address], [place], [place] [address]   Urine culture     Status: None   Collection Time: 02/05/19 10:50 PM   Specimen: In/Out Cath Urine  Result Value Ref Range Status   Specimen Description   Final    IN/OUT CATH URINE Performed at [facility], [address] [place], [place] [address]    Special Requests   Final    NONE Performed at [facility], [address] [place], [place] [address]    Culture   Final    NO GROWTH Performed at [facility], [place] [address], [place], [place] [address]    Report Status 02/07/2019 FINAL  Final  C difficile quick scan w PCR reflex     Status: None   Collection Time: 02/06/19  5:24 PM   Specimen: STOOL  Result Value Ref Range Status   C Diff antigen NEGATIVE NEGATIVE Final   C Diff toxin NEGATIVE NEGATIVE Final   C Diff interpretation No C. difficile detected.  Final    Comment: Performed at [facility], [place] [address], [place], [place] [address]         Radiology Studies: CT ABDOMEN PELVIS W CONTRAST  Result Date: 02/05/2019 CLINICAL DATA:  Nausea vomiting diarrhea EXAM: CT ABDOMEN AND PELVIS WITH CONTRAST TECHNIQUE: Multidetector CT imaging of the abdomen and  pelvis was performed using the standard protocol following bolus administration of intravenous contrast. CONTRAST:  122m OMNIPAQUE IOHEXOL 300 MG/ML  SOLN COMPARISON:  None. FINDINGS: Lower chest: Lung bases demonstrate no acute consolidation or pleural effusion heart size within normal limits. Hepatobiliary: No focal hepatic abnormality. No calcified gallstone or biliary dilatation Pancreas: No inflammatory change.  Mildly prominent pancreatic duct Spleen: Normal in size without focal abnormality. Adrenals/Urinary Tract: Adrenal glands are unremarkable. Kidneys are normal, without renal calculi, focal lesion, or hydronephrosis. Bladder is unremarkable. Stomach/Bowel: The stomach is nonenlarged. No dilated small bowel. The appendix is not well seen but no right lower quadrant inflammatory process. Prominent wall thickening and inflammatory change extending from the distal transverse colon to the rectosigmoid colon. Vascular/Lymphatic: Aorta is non aneurysmal. Mild aortic atherosclerosis. No significant adenopathy. Reproductive: Status post hysterectomy. No adnexal masses. Other: No free air.  Trace fluid in the left colic gutter. Musculoskeletal: 8 mm anterolisthesis L4 on L5. Advanced degenerative changes L4-L5 and L5-S1. IMPRESSION: 1. Prominent wall thickening and associated inflammatory changes extending from the distal transverse colon to the rectosigmoid colon, consistent with colitis of infectious, inflammatory, or ischemic etiology. No perforation or free air identified. Electronically Signed   By: KDonavan FoilM.D.   On: 02/05/2019 20:29   DG Chest Port 1 View  Result Date: 02/05/2019 CLINICAL DATA:  Former smoker; diarrhea; elevated WBC; FUO; h/o bronchitisfever EXAM: PORTABLE CHEST 1 VIEW COMPARISON:  Radiograph 02/19/2014 FINDINGS: Normal mediastinum and cardiac silhouette. Normal pulmonary vasculature. No evidence of effusion, infiltrate, or pneumothorax. No acute bony abnormality. IMPRESSION:  Normal chest radiograph Electronically Signed   By: SSuzy BouchardM.D.   On: 02/05/2019 20:16   VAS [place]OWER EXTREMITY VENOUS (DVT)  Result Date: 02/06/2019  Lower Venous Study Indications: Swelling.  Limitations: Body habitus and poor ultrasound/tissue interface. Comparison Study: No prior  study. Performing Technologist: Maudry Mayhew MHA, RDMS, RVT, RDCS  Examination Guidelines: A complete evaluation includes B-mode imaging, spectral Doppler, color Doppler, and power Doppler as needed of all accessible portions of each vessel. Bilateral testing is considered an integral part of a complete examination. Limited examinations for reoccurring indications may be performed as noted.  +-----+---------------+---------+-----------+----------+--------------+ RIGHTCompressibilityPhasicitySpontaneityPropertiesThrombus Aging +-----+---------------+---------+-----------+----------+--------------+ CFV  Full           Yes      Yes                                 +-----+---------------+---------+-----------+----------+--------------+   +---------+---------------+---------+-----------+----------+--------------+ LEFT     CompressibilityPhasicitySpontaneityPropertiesThrombus Aging +---------+---------------+---------+-----------+----------+--------------+ CFV      Full           Yes      Yes                                 +---------+---------------+---------+-----------+----------+--------------+ SFJ      Full                                                        +---------+---------------+---------+-----------+----------+--------------+ FV Prox  Full                                                        +---------+---------------+---------+-----------+----------+--------------+ FV Mid   Full                                                        +---------+---------------+---------+-----------+----------+--------------+ FV DistalFull                                                         +---------+---------------+---------+-----------+----------+--------------+ PFV      Full                                                        +---------+---------------+---------+-----------+----------+--------------+ POP      Full           Yes      Yes                                 +---------+---------------+---------+-----------+----------+--------------+ PTV      Full                                                        +---------+---------------+---------+-----------+----------+--------------+  PERO     Full                                                        +---------+---------------+---------+-----------+----------+--------------+ Limited visualization of left posterior tibial and peroneal veins.    Summary: Right: No evidence of common femoral vein obstruction. Left: There is no evidence of deep vein thrombosis in the lower extremity. However, portions of this examination were limited- see technologist comments above. No cystic structure found in the popliteal fossa.  *See table(s) above for measurements and observations. Electronically signed by Ruta Hinds MD on 02/06/2019 at 1:49:58 PM.    Final         Scheduled Meds: . insulin aspart  0-9 Units Subcutaneous Q4H   Continuous Infusions: . sodium chloride 10 mL/hr at 02/06/19 1646  . cefTRIAXone (ROCEPHIN)  IV Stopped (02/06/19 2232)  . metronidazole 500 mg (02/07/19 0509)     LOS: 1 day    Time spent: More than 50% of that time was spent in counseling and/or coordination of care.      Shelly Coss, MD [facility] Pager [phone]  If 7PM-7AM, please contact night-coverage [email or web address] Password [email or web address] 02/07/2019, 11:17 AM

## 2019-02-08 LAB — CBC WITH DIFFERENTIAL/PLATELET
Abs Immature Granulocytes: 0.02 10*3/uL (ref 0.00–0.07)
Basophils Absolute: 0 10*3/uL (ref 0.0–0.1)
Basophils Relative: 1 %
Eosinophils Absolute: 0.2 10*3/uL (ref 0.0–0.5)
Eosinophils Relative: 3 %
HCT: 43.2 % (ref 36.0–46.0)
Hemoglobin: 13.7 g/dL (ref 12.0–15.0)
Immature Granulocytes: 0 %
Lymphocytes Relative: 22 %
Lymphs Abs: 1.5 10*3/uL (ref 0.7–4.0)
MCH: 29.5 pg (ref 26.0–34.0)
MCHC: 31.7 g/dL (ref 30.0–36.0)
MCV: 92.9 fL (ref 80.0–100.0)
Monocytes Absolute: 0.8 10*3/uL (ref 0.1–1.0)
Monocytes Relative: 12 %
Neutro Abs: 4.1 10*3/uL (ref 1.7–7.7)
Neutrophils Relative %: 62 %
Platelets: 225 10*3/uL (ref 150–400)
RBC: 4.65 MIL/uL (ref 3.87–5.11)
RDW: 14.6 % (ref 11.5–15.5)
WBC: 6.5 10*3/uL (ref 4.0–10.5)
nRBC: 0 % (ref 0.0–0.2)

## 2019-02-08 LAB — BASIC METABOLIC PANEL
Anion gap: 13 (ref 5–15)
BUN: 11 mg/dL (ref 8–23)
CO2: 23 mmol/L (ref 22–32)
Calcium: 8.7 mg/dL — ABNORMAL LOW (ref 8.9–10.3)
Chloride: 102 mmol/L (ref 98–111)
Creatinine, Ser: 0.93 mg/dL (ref 0.44–1.00)
GFR calc Af Amer: >60 mL/min (ref >60)
GFR calc non Af Amer: >60 mL/min (ref >60)
Glucose, Bld: 80 mg/dL (ref 70–99)
Potassium: 3.5 mmol/L (ref 3.5–5.1)
Sodium: 138 mmol/L (ref 135–145)

## 2019-02-08 LAB — GI PATHOGEN PANEL BY PCR, STOOL

## 2019-02-08 LAB — GLUCOSE, CAPILLARY
Glucose-Capillary: 105 mg/dL — ABNORMAL HIGH (ref 70–99)
Glucose-Capillary: 106 mg/dL — ABNORMAL HIGH (ref 70–99)
Glucose-Capillary: 136 mg/dL — ABNORMAL HIGH (ref 70–99)
Glucose-Capillary: 79 mg/dL (ref 70–99)
Glucose-Capillary: 83 mg/dL (ref 70–99)
Glucose-Capillary: 99 mg/dL (ref 70–99)

## 2019-02-08 MED ORDER — HYDROCORTISONE 0.5 % EX CREA
1.0000 "application " | TOPICAL_CREAM | CUTANEOUS | Status: DC | PRN
  Administered 2019-02-08: 1 via TOPICAL
  Filled 2019-02-08: qty 28.35

## 2019-02-08 MED ORDER — SODIUM CHLORIDE 0.9 % IV SOLN: INTRAVENOUS | Status: DC

## 2019-02-08 NOTE — Care Management Important Message (Signed)
Important Message  Patient Details IM Letter given to [facility] Case Manager to present to the Patient Name: Madison Ball MRN: [id number] Date of Birth: [birth year]   Medicare Important Message Given:  Yes     Kerin Salen 02/08/2019, 12:06 PM

## 2019-02-08 NOTE — Progress Notes (Signed)
PROGRESS NOTE    Madison Ball  CWU:[id number] DOB: July 13, 1950 DOA: 02/05/2019 PCP: Lucianne Lei, MD   Brief Narrative:  Patient Is a 68 year old female with past medical history of bronchitis, type 2 diabetes mellitus, dyslipidemia, eczema who presented to the emergency department with acute onset of left-sided abdominal pain, vomiting, diarrhea.  Diarrhea was reportedly bloody.  No recent use of antibiotics or blood thinners except for aspirin.  In the emergency department she was found to have low-grade temperature, hypotension.  Bright red blood was noted on digital rectal exam.  CT abdomen/pelvis showed left-sided colitis involving the distal transverse and rectosigmoid colon.  GI was consulted.  Recommended to continue antibiotics and follow-up as an outpatient.  12/31: Patient states she had a rough night.  She had multiple loose bowel stools yesterday evening.  Some loose stool were bloody but not that significant.  Reverted back to clear liquid diet.  Assessment & Plan:   Principal Problem:   Colitis Active Problems:   Acute lower GI bleeding   Sepsis ([place])   Hypokalemia   Type 2 diabetes mellitus (HCC)   Left-sided colitis: Unclear etiology.  Ischemic colitis versus infectious versus inflammatory bowel disease.  Last colonoscopy about 6 years ago by Dr. Collene Mares. Started on antibiotics.  C. difficile negative.  GI pathogen panel pending.  On ceftriaxone and Flagyl for now.  Continues to complain of left-sided abdominal pain, some back pain. on  clear liquid diet. GI was consulted.  Recommend to continue antibiotics and follow-up as outpatient.  Suspected sepsis: Was hypotensive, febrile, lactic acidosis and presentation.  Afebrile  today.  Continue current antibiotics.  Cultures have been sent, no growth till date.  Hypokalemia: Supplemented and corrected  Diabetes mellitus type 2: Continue sliding scale insulin for now.  On Metformin and Jardiance at home.  Hemoglobin A1c of  6.6.  Asthma/bronchitis: Follows with pulmonology as an outpatient.  On Dulera at home.  Currently stable.         DVT prophylaxis:SCD s Code Status: Full Family Communication: None present at bedside Disposition Plan: Home after improvement in the abdominal pain, tolerance of diet  Consultants: GI  Procedures: None  Antimicrobials:  Anti-infectives (From admission, onward)   Start     Dose/Rate Route Frequency Ordered Stop   02/06/19 2000  cefTRIAXone (ROCEPHIN) 2 g in sodium chloride 0.9 % 100 mL IVPB     2 g 200 mL/hr over 30 Minutes Intravenous Every 24 hours 02/06/19 0327     02/06/19 0345  metroNIDAZOLE (FLAGYL) IVPB 500 mg     500 mg 100 mL/hr over 60 Minutes Intravenous Every 8 hours 02/06/19 0327     02/05/19 2000  cefTRIAXone (ROCEPHIN) 2 g in sodium chloride 0.9 % 100 mL IVPB     2 g 200 mL/hr over 30 Minutes Intravenous  Once 02/05/19 1945 02/05/19 2121   02/05/19 2000  metroNIDAZOLE (FLAGYL) IVPB 500 mg     500 mg 100 mL/hr over 60 Minutes Intravenous  Once 02/05/19 1945 02/05/19 2133      Subjective:  Patient seen and examined the bedside this morning.  Hemodynamically stable.  She said she had a rough night.  Complains of persistent pain on the left lower quadrant along with back pain.  She had 4 loose bowel movements yesterday evening.  No bowel movement today.  Wants to go back to clear liquid diet.  Objective: Vitals:   02/07/19 0356 02/07/19 1415 02/07/19 2027 02/08/19 0519  BP: 102/69 118/75 125/81  120/69  Pulse: (!) 102 90 96 90  Resp: 19 18 19 19   Temp: 100.3 F (37.9 C) 98.9 F (37.2 C) 99.5 F (37.5 C) 98.4 F (36.9 C)  TempSrc:  Oral Oral Oral  SpO2: 94% 94% 93% 94%  Weight:      Height:        Intake/Output Summary (Last 24 hours) at 02/08/2019 1416 Last data filed at 02/08/2019 0600 Gross per 24 hour  Intake 523.19 ml  Output --  Net 523.19 ml   Filed Weights   02/05/19 1642 02/06/19 0236  Weight: 62.1 kg 61.7 kg     Examination:  General exam: Appears calm and comfortable ,Not in distress,average built HEENT:PERRL,Oral mucosa moist, Ear/Nose normal on gross exam Respiratory system: Bilateral equal air entry, normal vesicular breath sounds, no wheezes or crackles  Cardiovascular system: S1 & S2 heard, RRR. No JVD, murmurs, rubs, gallops or clicks. No pedal edema. Gastrointestinal system: Abdomen is nondistended, soft . No organomegaly or masses felt. Normal bowel sounds heard.  Tenderness in the left lower quadrant Central nervous system: Alert and oriented. No focal neurological deficits. Extremities: No edema, no clubbing ,no cyanosis, distal peripheral pulses palpable. Skin: No rashes, lesions or ulcers,no icterus ,no pallor    Data Reviewed: I have personally reviewed following labs and imaging studies  CBC: Recent Labs  Lab 02/05/19 1842 02/06/19 0430 02/06/19 0908 02/08/19 0609  WBC 12.5* 10.4 8.8 6.5  NEUTROABS 9.3*  --   --  4.1  HGB 17.2* 14.8 13.6 13.7  HCT 51.8* 46.9* 42.7 43.2  MCV 89.9 94.2 93.8 92.9  PLT 239 213 200 902   Basic Metabolic Panel: Recent Labs  Lab 02/05/19 1842 02/06/19 0430 02/07/19 0839 02/08/19 0609  NA 140 141 136 138  K 2.7* 2.9* 3.9 3.5  CL 94* 102 103 102  CO2 30 25 23 23   GLUCOSE 116* 94 102* 80  BUN 17 12 10 11   CREATININE 1.13* 1.03* 1.04* 0.93  CALCIUM 9.6 8.2* 8.7* 8.7*  MG  --  1.8  --   --    GFR: Estimated Creatinine Clearance: 47.5 mL/min (by C-G formula based on SCr of 0.93 mg/dL). Liver Function Tests: Recent Labs  Lab 02/05/19 1842  AST 36  ALT 27  ALKPHOS 56  BILITOT 1.4*  PROT 7.7  ALBUMIN 4.1   Recent Labs  Lab 02/05/19 1842  LIPASE 28   No results for input(s): AMMONIA in the last 168 hours. Coagulation Profile: No results for input(s): INR, PROTIME in the last 168 hours. Cardiac Enzymes: No results for input(s): CKTOTAL, CKMB, CKMBINDEX, TROPONINI in the last 168 hours. BNP (last 3 results) No results  for input(s): PROBNP in the last 8760 hours. HbA1C: Recent Labs    02/06/19 0430  HGBA1C 6.6*   CBG: Recent Labs  Lab 02/07/19 2029 02/08/19 0012 02/08/19 0356 02/08/19 0730 02/08/19 1146  GLUCAP 80 99 83 106* 105*   Lipid Profile: No results for input(s): CHOL, HDL, LDLCALC, TRIG, CHOLHDL, LDLDIRECT in the last 72 hours. Thyroid Function Tests: Recent Labs    02/06/19 0430  TSH 0.599   Anemia Panel: No results for input(s): VITAMINB12, FOLATE, FERRITIN, TIBC, IRON, RETICCTPCT in the last 72 hours. Sepsis Labs: Recent Labs  Lab 02/05/19 1842 02/05/19 2037 02/06/19 0430 02/06/19 0908  LATICACIDVEN 2.6* 2.0* 2.5* 1.0    Recent Results (from the past 240 hour(s))  Blood culture (routine x 2)     Status: None (Preliminary result)  Collection Time: 02/05/19  6:40 PM   Specimen: BLOOD  Result Value Ref Range Status   Specimen Description   Final    BLOOD LEFT ANTECUBITAL Performed at [facility], [address] [place], [place] [address]    Special Requests   Final    BOTTLES DRAWN AEROBIC AND ANAEROBIC Blood Culture adequate volume Performed at [facility], [address] [place], [place] [address]    Culture   Final    NO GROWTH 3 DAYS Performed at [facility], [place] [address] [place], [place] [address]    Report Status PENDING  Incomplete  SARS Coronavirus 2 Ag (30 min TAT) - Nasal Swab (BD Veritor Kit)     Status: None   Collection Time: 02/05/19  6:42 PM   Specimen: Nasal Swab (BD Veritor Kit)  Result Value Ref Range Status   SARS Coronavirus 2 Ag NEGATIVE NEGATIVE Final    Comment: (NOTE) SARS-CoV-2 antigen NOT DETECTED.  Negative results are presumptive.  Negative results do not preclude SARS-CoV-2 infection and should not be used as the sole basis for treatment or other patient management decisions, including infection  control decisions, particularly in the presence of clinical signs and  symptoms consistent with  COVID-19, or in those who have been in contact with the virus.  Negative results must be combined with clinical observations, patient history, and epidemiological information. The expected result is Negative. Fact Sheet for Patients: PodPark.tn Fact Sheet for Healthcare Providers: GiftContent.is This test is not yet approved or cleared by the [place] FDA and  has been authorized for detection and/or diagnosis of SARS-CoV-2 by FDA under an Emergency Use Authorization (EUA).  This EUA will remain in effect (meaning this test can be used) for the duration of  the COVID-19 de claration under Section 564(b)(1) of the Act, 21 U.S.C. section 360bbb-3(b)(1), unless the authorization is terminated or revoked sooner. Performed at [facility], [address] [place], [place] [address]   Blood culture (routine x 2)     Status: None (Preliminary result)   Collection Time: 02/05/19  6:42 PM   Specimen: BLOOD  Result Value Ref Range Status   Specimen Description   Final    BLOOD RIGHT ANTECUBITAL Performed at [facility], [address] [place], [place] [address]    Special Requests   Final    BOTTLES DRAWN AEROBIC AND ANAEROBIC Blood Culture adequate volume Performed at [facility], [address] [place], [place] [address]    Culture   Final    NO GROWTH 3 DAYS Performed at [facility], [place] [address], [place], [place] [address]    Report Status PENDING  Incomplete  SARS CORONAVIRUS 2 (TAT 6-24 HRS) Nasopharyngeal Nasopharyngeal Swab     Status: None   Collection Time: 02/05/19  9:08 PM   Specimen: Nasopharyngeal Swab  Result Value Ref Range Status   SARS Coronavirus 2 NEGATIVE NEGATIVE Final    Comment: (NOTE) SARS-CoV-2 target nucleic acids are NOT DETECTED. The SARS-CoV-2 RNA is generally detectable in upper and lower respiratory specimens during the acute phase of infection.  Negative results do not preclude SARS-CoV-2 infection, do not rule out co-infections with other pathogens, and should not be used as the sole basis for treatment or other patient management decisions. Negative results must be combined with clinical observations, patient history, and epidemiological information. The expected result is Negative. Fact  Sheet for Patients: SugarRoll.be Fact Sheet for Healthcare Providers: https://www.woods-mathews.com/ This test is not yet approved or cleared by the [place] FDA and  has been authorized for detection and/or diagnosis of SARS-CoV-2 by FDA under an Emergency Use Authorization (EUA). This EUA will remain  in effect (meaning this test can be used) for the duration of the COVID-19 declaration under Section 56 4(b)(1) of the Act, 21 U.S.C. section 360bbb-3(b)(1), unless the authorization is terminated or revoked sooner. Performed at [facility], [place] [address] [place], [place] [address]   Urine culture     Status: None   Collection Time: 02/05/19 10:50 PM   Specimen: In/Out Cath Urine  Result Value Ref Range Status   Specimen Description   Final    IN/OUT CATH URINE Performed at [facility], [address] [place], [place] [address]    Special Requests   Final    NONE Performed at [facility], [address] [place], [place] [address]    Culture   Final    NO GROWTH Performed at [facility], [place] [address], [place], [place] [address]    Report Status 02/07/2019 FINAL  Final  C difficile quick scan w PCR reflex     Status: None   Collection Time: 02/06/19  5:24 PM   Specimen: STOOL  Result Value Ref Range Status   C Diff antigen NEGATIVE NEGATIVE Final   C Diff toxin NEGATIVE NEGATIVE Final   C Diff interpretation No C. difficile detected.  Final    Comment: Performed at [facility], [place] [address] [place], [place] [address]           Radiology Studies: No results found.      Scheduled Meds: . insulin aspart  0-9 Units Subcutaneous Q4H   Continuous Infusions: . sodium chloride 10 mL/hr at 02/08/19 1319  . sodium chloride    . cefTRIAXone (ROCEPHIN)  IV 2 g (02/07/19 2053)  . metronidazole 500 mg (02/08/19 1321)     LOS: 2 days    Time spent: 25 mins.More than 50% of that time was spent in counseling and/or coordination of care.      Shelly Coss, MD [facility] Pager [phone]  If 7PM-7AM, please contact night-coverage [email or web address] Password [email or web address] 02/08/2019, 2:16 PM

## 2019-02-09 LAB — GLUCOSE, CAPILLARY
Glucose-Capillary: 110 mg/dL — ABNORMAL HIGH (ref 70–99)
Glucose-Capillary: 131 mg/dL — ABNORMAL HIGH (ref 70–99)
Glucose-Capillary: 73 mg/dL (ref 70–99)
Glucose-Capillary: 77 mg/dL (ref 70–99)
Glucose-Capillary: 83 mg/dL (ref 70–99)
Glucose-Capillary: 86 mg/dL (ref 70–99)

## 2019-02-09 MED ORDER — INSULIN ASPART 100 UNIT/ML ~~LOC~~ SOLN: 0.0000 [IU] | Freq: Every day | SUBCUTANEOUS | Status: DC

## 2019-02-09 MED ORDER — INSULIN ASPART 100 UNIT/ML ~~LOC~~ SOLN: 0.0000 [IU] | Freq: Three times a day (TID) | SUBCUTANEOUS | Status: DC

## 2019-02-09 NOTE — Progress Notes (Signed)
PROGRESS NOTE    Madison Ball  TIW:[id number] DOB: [birth year] DOA: 02/05/2019 PCP: Lucianne Lei, MD   Brief Narrative:  Patient Is a 69 year old female with past medical history of bronchitis, type 2 diabetes mellitus, dyslipidemia, eczema who presented to the emergency department with acute onset of left-sided abdominal pain, vomiting, diarrhea.  Diarrhea was reportedly bloody.  No recent use of antibiotics or blood thinners except for aspirin.  In the emergency department she was found to have low-grade temperature, hypotension.  Bright red blood was noted on digital rectal exam.  CT abdomen/pelvis showed left-sided colitis involving the distal transverse and rectosigmoid colon.  GI was consulted.  Recommended to continue antibiotics and follow-up as an outpatient.  1/1: This morning she feels better.  Her abdominal pain has improved.  No bowel movement this morning.  Had 2 loose bowel movements yesterday which were nonbloody.  Eager to advance the diet to full liquid.  Does not feel ready to go home yet.  Assessment & Plan:   Principal Problem:   Colitis Active Problems:   Acute lower GI bleeding   Sepsis ([place])   Hypokalemia   Type 2 diabetes mellitus (HCC)   Left-sided colitis: Unclear etiology.  Ischemic colitis  versus inflammatory bowel disease.  Last colonoscopy about 6 years ago by Dr. Collene Mares. Started on antibiotics initially now D/ced because she GI  pathogen was negative.  C. difficile negative. She had  complained of left-sided abdominal pain, some back pain.  Abdominal pain has improved today.  No bowel movement today.  Had 2 loose nonbloody bowel movement yesterday.  . GI was consulted.  Recommend to  follow-up as outpatient.  Suspected sepsis: Was hypotensive, febrile, lactic acidosis on presentation.  Afebrile  today.  Hemodynamically stable now..  Cultures have been sent, no growth till date.  Hypokalemia: Supplemented and corrected  Diabetes mellitus type 2:  Continue sliding scale insulin for now.  On Metformin and Jardiance at home.  Hemoglobin A1c of 6.6.  Asthma/bronchitis: Follows with pulmonology as an outpatient.  On Dulera at home.  Currently stable.         DVT prophylaxis:SCD s Code Status: Full Family Communication: None present at bedside Disposition Plan: Home tomorrow if abdominal pain improves, tolerates diet Consultants: GI  Procedures: None  Antimicrobials:  Anti-infectives (From admission, onward)   Start     Dose/Rate Route Frequency Ordered Stop   02/06/19 2000  cefTRIAXone (ROCEPHIN) 2 g in sodium chloride 0.9 % 100 mL IVPB  Status:  Discontinued     2 g 200 mL/hr over 30 Minutes Intravenous Every 24 hours 02/06/19 0327 02/09/19 0940   02/06/19 0345  metroNIDAZOLE (FLAGYL) IVPB 500 mg  Status:  Discontinued     500 mg 100 mL/hr over 60 Minutes Intravenous Every 8 hours 02/06/19 0327 02/09/19 0940   02/05/19 2000  cefTRIAXone (ROCEPHIN) 2 g in sodium chloride 0.9 % 100 mL IVPB     2 g 200 mL/hr over 30 Minutes Intravenous  Once 02/05/19 1945 02/05/19 2121   02/05/19 2000  metroNIDAZOLE (FLAGYL) IVPB 500 mg     500 mg 100 mL/hr over 60 Minutes Intravenous  Once 02/05/19 1945 02/05/19 2133       Objective: Vitals:   02/08/19 0519 02/08/19 1418 02/08/19 2107 02/09/19 0405  BP: 120/69 129/79 122/84 104/64  Pulse: 90 87 94 81  Resp: _0 Temp: 98.4 F (36.9 C) 98.2 F (36.8 C) 98.8 F (37.1 C) 99.2 F (37.3  C)  TempSrc: Oral Oral Oral Oral  SpO2: 94% 96% 91% 93%  Weight:      Height:        Intake/Output Summary (Last 24 hours) at 02/09/2019 1212 Last data filed at 02/09/2019 0600 Gross per 24 hour  Intake 1985.19 ml  Output --  Net 1985.19 ml   Filed Weights   02/05/19 1642 02/06/19 0236  Weight: 62.1 kg 61.7 kg    Examination:  General exam: Appears calm and comfortable ,Not in distress,average built HEENT:PERRL,Oral mucosa moist, Ear/Nose normal on gross exam Respiratory system:  Bilateral equal air entry, normal vesicular breath sounds, no wheezes or crackles  Cardiovascular system: S1 & S2 heard, RRR. No JVD, murmurs, rubs, gallops or clicks. No pedal edema. Gastrointestinal system: Abdomen is nondistended, soft . No organomegaly or masses felt. Normal bowel sounds heard.  Tenderness in the left lower quadrant Central nervous system: Alert and oriented. No focal neurological deficits. Extremities: No edema, no clubbing ,no cyanosis, distal peripheral pulses palpable. Skin: No rashes, lesions or ulcers,no icterus ,no pallor    Data Reviewed: I have personally reviewed following labs and imaging studies  CBC: Recent Labs  Lab 02/05/19 1842 02/06/19 0430 02/06/19 0908 02/08/19 0609  WBC 12.5* 10.4 8.8 6.5  NEUTROABS 9.3*  --   --  4.1  HGB 17.2* 14.8 13.6 13.7  HCT 51.8* 46.9* 42.7 43.2  MCV 89.9 94.2 93.8 92.9  PLT 239 213 200 287   Basic Metabolic Panel: Recent Labs  Lab 02/05/19 1842 02/06/19 0430 02/07/19 0839 02/08/19 0609  NA 140 141 136 138  K 2.7* 2.9* 3.9 3.5  CL 94* 102 103 102  CO2 _0 GLUCOSE 116* 94 102* 80  BUN _1 CREATININE 1.13* 1.03* 1.04* 0.93  CALCIUM 9.6 8.2* 8.7* 8.7*  MG  --  1.8  --   --    GFR: Estimated Creatinine Clearance: 47.5 mL/min (by C-G formula based on SCr of 0.93 mg/dL). Liver Function Tests: Recent Labs  Lab 02/05/19 1842  AST 36  ALT 27  ALKPHOS 56  BILITOT 1.4*  PROT 7.7  ALBUMIN 4.1   Recent Labs  Lab 02/05/19 1842  LIPASE 28   No results for input(s): AMMONIA in the last 168 hours. Coagulation Profile: No results for input(s): INR, PROTIME in the last 168 hours. Cardiac Enzymes: No results for input(s): CKTOTAL, CKMB, CKMBINDEX, TROPONINI in the last 168 hours. BNP (last 3 results) No results for input(s): PROBNP in the last 8760 hours. HbA1C: No results for input(s): HGBA1C in the last 72 hours. CBG: Recent Labs  Lab 02/08/19 2110 02/09/19 0005 02/09/19 0408  02/09/19 0747 02/09/19 1145  GLUCAP 79 83 110* 73 86   Lipid Profile: No results for input(s): CHOL, HDL, LDLCALC, TRIG, CHOLHDL, LDLDIRECT in the last 72 hours. Thyroid Function Tests: No results for input(s): TSH, T4TOTAL, FREET4, T3FREE, THYROIDAB in the last 72 hours. Anemia Panel: No results for input(s): VITAMINB12, FOLATE, FERRITIN, TIBC, IRON, RETICCTPCT in the last 72 hours. Sepsis Labs: Recent Labs  Lab 02/05/19 1842 02/05/19 2037 02/06/19 0430 02/06/19 0908  LATICACIDVEN 2.6* 2.0* 2.5* 1.0    Recent Results (from the past 240 hour(s))  Blood culture (routine x 2)     Status: None (Preliminary result)   Collection Time: 02/05/19  6:40 PM   Specimen: BLOOD  Result Value Ref Range Status   Specimen Description   Final    BLOOD LEFT ANTECUBITAL Performed at  [facility], [address] [place], [place] [address]    Special Requests   Final    BOTTLES DRAWN AEROBIC AND ANAEROBIC Blood Culture adequate volume Performed at [facility], [address] [place], [place] [address]    Culture   Final    NO GROWTH 3 DAYS Performed at [facility], [place] [address], [place], [place] [address]    Report Status PENDING  Incomplete  SARS Coronavirus 2 Ag (30 min TAT) - Nasal Swab (BD Veritor Kit)     Status: None   Collection Time: 02/05/19  6:42 PM   Specimen: Nasal Swab (BD Veritor Kit)  Result Value Ref Range Status   SARS Coronavirus 2 Ag NEGATIVE NEGATIVE Final    Comment: (NOTE) SARS-CoV-2 antigen NOT DETECTED.  Negative results are presumptive.  Negative results do not preclude SARS-CoV-2 infection and should not be used as the sole basis for treatment or other patient management decisions, including infection  control decisions, particularly in the presence of clinical signs and  symptoms consistent with COVID-19, or in those who have been in contact with the virus.  Negative results must be combined with clinical observations,  patient history, and epidemiological information. The expected result is Negative. Fact Sheet for Patients: PodPark.tn Fact Sheet for Healthcare Providers: GiftContent.is This test is not yet approved or cleared by the [place] FDA and  has been authorized for detection and/or diagnosis of SARS-CoV-2 by FDA under an Emergency Use Authorization (EUA).  This EUA will remain in effect (meaning this test can be used) for the duration of  the COVID-19 de claration under Section 564(b)(1) of the Act, 21 U.S.C. section 360bbb-3(b)(1), unless the authorization is terminated or revoked sooner. Performed at [facility], [address] [place], [place] [address]   Blood culture (routine x 2)     Status: None (Preliminary result)   Collection Time: 02/05/19  6:42 PM   Specimen: BLOOD  Result Value Ref Range Status   Specimen Description   Final    BLOOD RIGHT ANTECUBITAL Performed at [facility], [address] [place], [place] [address]    Special Requests   Final    BOTTLES DRAWN AEROBIC AND ANAEROBIC Blood Culture adequate volume Performed at [facility], [address] [place], [place] [address]    Culture   Final    NO GROWTH 3 DAYS Performed at [facility], [place] [address], [place], [place] [address]    Report Status PENDING  Incomplete  SARS CORONAVIRUS 2 (TAT 6-24 HRS) Nasopharyngeal Nasopharyngeal Swab     Status: None   Collection Time: 02/05/19  9:08 PM   Specimen: Nasopharyngeal Swab  Result Value Ref Range Status   SARS Coronavirus 2 NEGATIVE NEGATIVE Final    Comment: (NOTE) SARS-CoV-2 target nucleic acids are NOT DETECTED. The SARS-CoV-2 RNA is generally detectable in upper and lower respiratory specimens during the acute phase of infection. Negative results do not preclude SARS-CoV-2 infection, do not rule out co-infections with other pathogens, and should not be  used as the sole basis for treatment or other patient management decisions. Negative results must be combined with clinical observations, patient history, and epidemiological information. The expected result is Negative. Fact Sheet for Patients: SugarRoll.be Fact Sheet for Healthcare Providers: https://www.woods-mathews.com/ This test is not yet approved or cleared by the [place] FDA and  has been authorized for detection and/or  diagnosis of SARS-CoV-2 by FDA under an Emergency Use Authorization (EUA). This EUA will remain  in effect (meaning this test can be used) for the duration of the COVID-19 declaration under Section 56 4(b)(1) of the Act, 21 U.S.C. section 360bbb-3(b)(1), unless the authorization is terminated or revoked sooner. Performed at [facility], [place] [address], [place], [place] [address]   Urine culture     Status: None   Collection Time: 02/05/19 10:50 PM   Specimen: In/Out Cath Urine  Result Value Ref Range Status   Specimen Description   Final    IN/OUT CATH URINE Performed at [facility], [address] [place], [place] [address]    Special Requests   Final    NONE Performed at [facility], [address] [place], [place] [address]    Culture   Final    NO GROWTH Performed at [facility], [place] [address] [place], [place] [address]    Report Status 02/07/2019 FINAL  Final  C difficile quick scan w PCR reflex     Status: None   Collection Time: 02/06/19  5:24 PM   Specimen: STOOL  Result Value Ref Range Status   C Diff antigen NEGATIVE NEGATIVE Final   C Diff toxin NEGATIVE NEGATIVE Final   C Diff interpretation No C. difficile detected.  Final    Comment: Performed at [facility], [place] [address], [place], [place] [address]  GI pathogen panel by PCR, stool     Status: None   Collection Time: 02/06/19  5:24 PM   Specimen: Stool  Result Value Ref  Range Status   Plesiomonas shigelloides NOT DETECTED NOT DETECTED Final   Yersinia enterocolitica NOT DETECTED NOT DETECTED Final   Vibrio NOT DETECTED NOT DETECTED Final   Enteropathogenic E coli NOT DETECTED NOT DETECTED Final   E coli (ETEC) LT/ST NOT DETECTED NOT DETECTED Final   E coli 2119 by PCR Not applicable NOT DETECTED Final   Cryptosporidium by PCR NOT DETECTED NOT DETECTED Final   Entamoeba histolytica NOT DETECTED NOT DETECTED Final   Adenovirus F 40/41 NOT DETECTED NOT DETECTED Final   Norovirus GI/GII NOT DETECTED NOT DETECTED Final   Sapovirus NOT DETECTED NOT DETECTED Final    Comment: (NOTE) Performed At: [facility] [place], [place] [id number] Rush Farmer MD YJ:[id number]    Vibrio cholerae NOT DETECTED NOT DETECTED Final   Campylobacter by PCR NOT DETECTED NOT DETECTED Final   Salmonella by PCR NOT DETECTED NOT DETECTED Final   E coli (STEC) NOT DETECTED NOT DETECTED Final   Enteroaggregative E coli NOT DETECTED NOT DETECTED Final   Shigella by PCR NOT DETECTED NOT DETECTED Final   Cyclospora cayetanensis NOT DETECTED NOT DETECTED Final   Astrovirus NOT DETECTED NOT DETECTED Final   G lamblia by PCR NOT DETECTED NOT DETECTED Final   Rotavirus A by PCR NOT DETECTED NOT DETECTED Final         Radiology Studies: No results found.      Scheduled Meds: . insulin aspart  0-9 Units Subcutaneous Q4H   Continuous Infusions: . sodium chloride 75 mL/hr at 02/08/19 1425     LOS: 3 days    Time spent: 25 mins.More than 50% of that time was spent in counseling and/or coordination of care.      Shelly Coss, MD [facility] Pager [phone]  If 7PM-7AM, please contact night-coverage [email or web address] Password [facility] 02/09/2019,  12:12 PM

## 2019-02-10 LAB — BASIC METABOLIC PANEL
Anion gap: 11 (ref 5–15)
BUN: 10 mg/dL (ref 8–23)
CO2: 25 mmol/L (ref 22–32)
Calcium: 8.6 mg/dL — ABNORMAL LOW (ref 8.9–10.3)
Chloride: 104 mmol/L (ref 98–111)
Creatinine, Ser: 0.93 mg/dL (ref 0.44–1.00)
GFR calc Af Amer: >60 mL/min (ref >60)
GFR calc non Af Amer: >60 mL/min (ref >60)
Glucose, Bld: 94 mg/dL (ref 70–99)
Potassium: 3.3 mmol/L — ABNORMAL LOW (ref 3.5–5.1)
Sodium: 140 mmol/L (ref 135–145)

## 2019-02-10 LAB — CULTURE, BLOOD (ROUTINE X 2)
Culture: NO GROWTH
Culture: NO GROWTH
Special Requests: ADEQUATE
Special Requests: ADEQUATE

## 2019-02-10 MED ORDER — POTASSIUM CHLORIDE 20 MEQ PO PACK
40.0000 meq | PACK | Freq: Once | ORAL | Status: AC
  Administered 2019-02-10: 40 meq via ORAL
  Filled 2019-02-10: qty 2

## 2019-02-10 NOTE — Discharge Summary (Signed)
Physician Discharge Summary  TANGA GLOOR QZE:[id number] DOB: Nov 25, 1950 DOA: 02/05/2019  PCP: Lucianne Lei, MD  Admit date: 02/05/2019 Discharge date: 02/10/2019  Admitted From: Home Disposition:  Home  Discharge Condition:Stable CODE STATUS:FULL Diet recommendation: Heart Healthy  Brief/Interim Summary: Patient Is a 69 year old female with past medical history of bronchitis, type 2 diabetes mellitus, dyslipidemia, eczema who presented to the emergency department with acute onset of left-sided abdominal pain, vomiting, diarrhea.  Diarrhea was reportedly bloody.  No recent use of antibiotics or blood thinners except for aspirin.  In the emergency department she was found to have low-grade temperature, hypotension.  Bright red blood was noted on digital rectal exam.  CT abdomen/pelvis showed left-sided colitis involving the distal transverse and rectosigmoid colon.  GI was consulted.  Outpatient follow-up recommended.  Currently her abdominal pain has significantly improved, she is tolerating soft diet.  She still hasfew episodes of loose stools but they are not bloody.  She is medically stable for discharge to home today.  She needs to follow-up with GI, Dr. Benson Norway, soon as possible as an outpatient.  Following problems were addressed during her hospitalization:  Left-sided colitis: Unclear etiology.  Ischemic colitis  versus inflammatory bowel disease.  Last colonoscopy about 6 years ago by Dr. Collene Mares. Started on antibiotics initially now D/ced because she GI  pathogen was negative.  C. difficile panel negative. She had  complained of left-sided abdominal pain, some back pain.  Abdominal pain has improved today.    Had few  loose nonbloody bowel movement this morning . GI was consulted.  Recommend to  follow-up as outpatient for a colonoscopy.  Suspected sepsis: Was hypotensive, febrile, lactic acidosis on presentation.  Afebrile  today.  Hemodynamically stable now..  Cultures have been sent, no  growth till date.Abx discontinued  Hypokalemia: Supplemented and corrected  Diabetes mellitus type 2:   On Metformin and Jardiance at home.  Hemoglobin A1c of 6.6.  Asthma/bronchitis: Follows with pulmonology as an outpatient.  On Dulera at home.  Currently stable.   Discharge Diagnoses:  Principal Problem:   Colitis Active Problems:   Acute lower GI bleeding   Sepsis ([place])   Hypokalemia   Type 2 diabetes mellitus ([place])    Discharge Instructions  Discharge Instructions    Diet - low sodium heart healthy   Complete by: As directed    Discharge instructions   Complete by: As directed    1)Follow up with Dr. Benson Norway, gastroenterology, in a week. 2)Takes soft diet for next 3-5 days. 3)Follow up with your PCP in a week.  Do a CBC, BMP test during the follow-up   Increase activity slowly   Complete by: As directed      Allergies as of 02/10/2019      Reactions   Emetine    Erythromycin    Reglan [metoclopramide]    Chlordiazepoxide Hcl Rash   Librium [chlordiazepoxide] Rash      Medication List    STOP taking these medications   fluticasone 50 MCG/ACT nasal spray Commonly known as: FLONASE   predniSONE 10 MG tablet Commonly known as: DELTASONE   ranitidine 300 MG capsule Commonly known as: ZANTAC     TAKE these medications   albuterol 108 (90 Base) MCG/ACT inhaler Commonly known as: ProAir HFA Inhale 2 puffs into the lungs every 4 (four) hours as needed for wheezing or shortness of breath.   azelastine 0.1 % nasal spray Commonly known as: ASTELIN Place 1-2 sprays into both nostrils 2 (two)  times daily as needed for rhinitis.   Dulera 200-5 MCG/ACT Aero Generic drug: mometasone-formoterol TAKE 2 PUFFS BY MOUTH TWICE A DAY What changed:   See the new instructions.  Another medication with the same name was removed. Continue taking this medication, and follow the directions you see here.   famotidine 20 MG tablet Commonly known as: Pepcid Take 1 tablet  (20 mg total) by mouth 2 (two) times daily. What changed: when to take this   Jardiance 10 MG Tabs tablet Generic drug: empagliflozin 10 mg daily.   Maxzide-25 37.5-25 MG tablet Generic drug: triamterene-hydrochlorothiazide Take 1 tablet by mouth Daily.   metFORMIN 500 MG tablet Commonly known as: GLUCOPHAGE Take 1 tablet by mouth Daily.   montelukast 10 MG tablet Commonly known as: SINGULAIR Take 1 tablet (10 mg total) by mouth at bedtime.   omeprazole 20 MG capsule Commonly known as: PriLOSEC Take 1 capsule (20 mg total) by mouth daily.   Vitamin E 400 units Tabs Take 2 tablets by mouth daily.   Vytorin 10-40 MG tablet Generic drug: ezetimibe-simvastatin Take 1 tablet by mouth Daily.      Follow-up Information    Lucianne Lei, MD. Schedule an appointment as soon as possible for a visit in 1 week(s).   Specialty: Family Medicine Contact information: [address] [place] [address] [phone]        Carol Ada, MD. Schedule an appointment as soon as possible for a visit in 1 week(s).   Specialty: Gastroenterology Contact information: [place], [place] [address] [phone]          Allergies  Allergen Reactions  . Emetine   . Erythromycin   . Reglan [Metoclopramide]   . Chlordiazepoxide Hcl Rash  . Librium [Chlordiazepoxide] Rash    Consultations:  GI   Procedures/Studies: CT ABDOMEN PELVIS W CONTRAST  Result Date: 02/05/2019 CLINICAL DATA:  Nausea vomiting diarrhea EXAM: CT ABDOMEN AND PELVIS WITH CONTRAST TECHNIQUE: Multidetector CT imaging of the abdomen and pelvis was performed using the standard protocol following bolus administration of intravenous contrast. CONTRAST:  134m OMNIPAQUE IOHEXOL 300 MG/ML  SOLN COMPARISON:  None. FINDINGS: Lower chest: Lung bases demonstrate no acute consolidation or pleural effusion heart size within normal limits. Hepatobiliary: No focal hepatic abnormality. No  calcified gallstone or biliary dilatation Pancreas: No inflammatory change.  Mildly prominent pancreatic duct Spleen: Normal in size without focal abnormality. Adrenals/Urinary Tract: Adrenal glands are unremarkable. Kidneys are normal, without renal calculi, focal lesion, or hydronephrosis. Bladder is unremarkable. Stomach/Bowel: The stomach is nonenlarged. No dilated small bowel. The appendix is not well seen but no right lower quadrant inflammatory process. Prominent wall thickening and inflammatory change extending from the distal transverse colon to the rectosigmoid colon. Vascular/Lymphatic: Aorta is non aneurysmal. Mild aortic atherosclerosis. No significant adenopathy. Reproductive: Status post hysterectomy. No adnexal masses. Other: No free air.  Trace fluid in the left colic gutter. Musculoskeletal: 8 mm anterolisthesis L4 on L5. Advanced degenerative changes L4-L5 and L5-S1. IMPRESSION: 1. Prominent wall thickening and associated inflammatory changes extending from the distal transverse colon to the rectosigmoid colon, consistent with colitis of infectious, inflammatory, or ischemic etiology. No perforation or free air identified. Electronically Signed   By: KDonavan FoilM.D.   On: 02/05/2019 20:29   DG Chest Port 1 View  Result Date: 02/05/2019 CLINICAL DATA:  Former smoker; diarrhea; elevated WBC; FUO; h/o bronchitisfever EXAM: PORTABLE CHEST 1 VIEW COMPARISON:  Radiograph 02/19/2014 FINDINGS: Normal mediastinum and cardiac  silhouette. Normal pulmonary vasculature. No evidence of effusion, infiltrate, or pneumothorax. No acute bony abnormality. IMPRESSION: Normal chest radiograph Electronically Signed   By: Suzy Bouchard M.D.   On: 02/05/2019 20:16   VAS [place] LOWER EXTREMITY VENOUS (DVT)  Result Date: 02/06/2019  Lower Venous Study Indications: Swelling.  Limitations: Body habitus and poor ultrasound/tissue interface. Comparison Study: No prior study. Performing Technologist: Maudry Mayhew MHA, RDMS, RVT, RDCS  Examination Guidelines: A complete evaluation includes B-mode imaging, spectral Doppler, color Doppler, and power Doppler as needed of all accessible portions of each vessel. Bilateral testing is considered an integral part of a complete examination. Limited examinations for reoccurring indications may be performed as noted.  +-----+---------------+---------+-----------+----------+--------------+ RIGHTCompressibilityPhasicitySpontaneityPropertiesThrombus Aging +-----+---------------+---------+-----------+----------+--------------+ CFV  Full           Yes      Yes                                 +-----+---------------+---------+-----------+----------+--------------+   +---------+---------------+---------+-----------+----------+--------------+ LEFT     CompressibilityPhasicitySpontaneityPropertiesThrombus Aging +---------+---------------+---------+-----------+----------+--------------+ CFV      Full           Yes      Yes                                 +---------+---------------+---------+-----------+----------+--------------+ SFJ      Full                                                        +---------+---------------+---------+-----------+----------+--------------+ FV Prox  Full                                                        +---------+---------------+---------+-----------+----------+--------------+ FV Mid   Full                                                        +---------+---------------+---------+-----------+----------+--------------+ FV DistalFull                                                        +---------+---------------+---------+-----------+----------+--------------+ PFV      Full                                                        +---------+---------------+---------+-----------+----------+--------------+ POP      Full           Yes      Yes                                  +---------+---------------+---------+-----------+----------+--------------+  PTV      Full                                                        +---------+---------------+---------+-----------+----------+--------------+ PERO     Full                                                        +---------+---------------+---------+-----------+----------+--------------+ Limited visualization of left posterior tibial and peroneal veins.    Summary: Right: No evidence of common femoral vein obstruction. Left: There is no evidence of deep vein thrombosis in the lower extremity. However, portions of this examination were limited- see technologist comments above. No cystic structure found in the popliteal fossa.  *See table(s) above for measurements and observations. Electronically signed by Ruta Hinds MD on 02/06/2019 at 1:49:58 PM.    Final        Subjective: Patient seen and examined the bedside this morning.  Hemodynamically stable for discharge today.  Discharge Exam: Vitals:   02/09/19 1958 02/10/19 0554  BP: 106/63 113/71  Pulse: 89 82  Resp: 19 19  Temp: 97.9 F (36.6 C) 98.9 F (37.2 C)  SpO2: 93% 91%   Vitals:   02/09/19 0405 02/09/19 1424 02/09/19 1958 02/10/19 0554  BP: 104/64 124/76 106/63 113/71  Pulse: 81 81 89 82  Resp: 19 18 19 19   Temp: 99.2 F (37.3 C) 97.9 F (36.6 C) 97.9 F (36.6 C) 98.9 F (37.2 C)  TempSrc: Oral Oral Oral Oral  SpO2: 93% 97% 93% 91%  Weight:      Height:        General: Pt is alert, awake, not in acute distress Cardiovascular: RRR, S1/S2 +, no rubs, no gallops Respiratory: CTA bilaterally, no wheezing, no rhonchi Abdominal: Soft, NT, ND, bowel sounds + Extremities: no edema, no cyanosis    The results of significant diagnostics from this hospitalization (including imaging, microbiology, ancillary and laboratory) are listed below for reference.     Microbiology: Recent Results (from the past 240 hour(s))  Blood culture  (routine x 2)     Status: None   Collection Time: 02/05/19  6:40 PM   Specimen: BLOOD  Result Value Ref Range Status   Specimen Description   Final    BLOOD LEFT ANTECUBITAL Performed at [facility], [address] [place], [place] [address]    Special Requests   Final    BOTTLES DRAWN AEROBIC AND ANAEROBIC Blood Culture adequate volume Performed at [facility], [address] [place], [place] [address]    Culture   Final    NO GROWTH 5 DAYS Performed at [facility], [place] [address], [place], [place] [address]    Report Status 02/10/2019 FINAL  Final  SARS Coronavirus 2 Ag (30 min TAT) - Nasal Swab (BD Veritor Kit)     Status: None   Collection Time: 02/05/19  6:42 PM   Specimen: Nasal Swab (BD Veritor Kit)  Result Value Ref Range Status   SARS Coronavirus 2 Ag NEGATIVE NEGATIVE Final    Comment: (NOTE) SARS-CoV-2 antigen NOT DETECTED.  Negative results are presumptive.  Negative  results do not preclude SARS-CoV-2 infection and should not be used as the sole basis for treatment or other patient management decisions, including infection  control decisions, particularly in the presence of clinical signs and  symptoms consistent with COVID-19, or in those who have been in contact with the virus.  Negative results must be combined with clinical observations, patient history, and epidemiological information. The expected result is Negative. Fact Sheet for Patients: PodPark.tn Fact Sheet for Healthcare Providers: GiftContent.is This test is not yet approved or cleared by the [place] FDA and  has been authorized for detection and/or diagnosis of SARS-CoV-2 by FDA under an Emergency Use Authorization (EUA).  This EUA will remain in effect (meaning this test can be used) for the duration of  the COVID-19 de claration under Section 564(b)(1) of the Act, 21 U.S.C. section 360bbb-3(b)(1),  unless the authorization is terminated or revoked sooner. Performed at [facility], [address] [place], [place] [address]   Blood culture (routine x 2)     Status: None   Collection Time: 02/05/19  6:42 PM   Specimen: BLOOD  Result Value Ref Range Status   Specimen Description   Final    BLOOD RIGHT ANTECUBITAL Performed at [facility], [address] [place], [place] [address]    Special Requests   Final    BOTTLES DRAWN AEROBIC AND ANAEROBIC Blood Culture adequate volume Performed at [facility], [address] [place], [place] [address]    Culture   Final    NO GROWTH 5 DAYS Performed at [facility], [place] [address] [place], [place] [address]    Report Status 02/10/2019 FINAL  Final  SARS CORONAVIRUS 2 (TAT 6-24 HRS) Nasopharyngeal Nasopharyngeal Swab     Status: None   Collection Time: 02/05/19  9:08 PM   Specimen: Nasopharyngeal Swab  Result Value Ref Range Status   SARS Coronavirus 2 NEGATIVE NEGATIVE Final    Comment: (NOTE) SARS-CoV-2 target nucleic acids are NOT DETECTED. The SARS-CoV-2 RNA is generally detectable in upper and lower respiratory specimens during the acute phase of infection. Negative results do not preclude SARS-CoV-2 infection, do not rule out co-infections with other pathogens, and should not be used as the sole basis for treatment or other patient management decisions. Negative results must be combined with clinical observations, patient history, and epidemiological information. The expected result is Negative. Fact Sheet for Patients: SugarRoll.be Fact Sheet for Healthcare Providers: https://www.woods-mathews.com/ This test is not yet approved or cleared by the [place] FDA and  has been authorized for detection and/or diagnosis of SARS-CoV-2 by FDA under an Emergency Use Authorization (EUA). This EUA will remain  in effect (meaning this test can be  used) for the duration of the COVID-19 declaration under Section 56 4(b)(1) of the Act, 21 U.S.C. section 360bbb-3(b)(1), unless the authorization is terminated or revoked sooner. Performed at [facility], [place] [address], [place], [place] [address]   Urine culture     Status: None   Collection Time: 02/05/19 10:50 PM   Specimen: In/Out Cath Urine  Result Value Ref Range Status   Specimen Description   Final    IN/OUT CATH URINE Performed at [facility], [address] [place], [place] [address]    Special Requests   Final    NONE Performed at [facility], [address] [place], [place] [address]    Culture  Final    NO GROWTH Performed at [facility], [place] [address], [place], [place] [address]    Report Status 02/07/2019 FINAL  Final  C difficile quick scan w PCR reflex     Status: None   Collection Time: 02/06/19  5:24 PM   Specimen: STOOL  Result Value Ref Range Status   C Diff antigen NEGATIVE NEGATIVE Final   C Diff toxin NEGATIVE NEGATIVE Final   C Diff interpretation No C. difficile detected.  Final    Comment: Performed at [facility], [place] [address], [place], [place] [address]  GI pathogen panel by PCR, stool     Status: None   Collection Time: 02/06/19  5:24 PM   Specimen: Stool  Result Value Ref Range Status   Plesiomonas shigelloides NOT DETECTED NOT DETECTED Final   Yersinia enterocolitica NOT DETECTED NOT DETECTED Final   Vibrio NOT DETECTED NOT DETECTED Final   Enteropathogenic E coli NOT DETECTED NOT DETECTED Final   E coli (ETEC) LT/ST NOT DETECTED NOT DETECTED Final   E coli 5400 by PCR Not applicable NOT DETECTED Final   Cryptosporidium by PCR NOT DETECTED NOT DETECTED Final   Entamoeba histolytica NOT DETECTED NOT DETECTED Final   Adenovirus F 40/41 NOT DETECTED NOT DETECTED Final   Norovirus GI/GII NOT DETECTED NOT DETECTED Final   Sapovirus NOT DETECTED NOT DETECTED Final    Comment:  (NOTE) Performed At: [facility] [place], [place] [id number] Rush Farmer MD TO:[id number]    Vibrio cholerae NOT DETECTED NOT DETECTED Final   Campylobacter by PCR NOT DETECTED NOT DETECTED Final   Salmonella by PCR NOT DETECTED NOT DETECTED Final   E coli (STEC) NOT DETECTED NOT DETECTED Final   Enteroaggregative E coli NOT DETECTED NOT DETECTED Final   Shigella by PCR NOT DETECTED NOT DETECTED Final   Cyclospora [place]ensis NOT DETECTED NOT DETECTED Final   Astrovirus NOT DETECTED NOT DETECTED Final   G lamblia by PCR NOT DETECTED NOT DETECTED Final   Rotavirus A by PCR NOT DETECTED NOT DETECTED Final     Labs: BNP (last 3 results) No results for input(s): BNP in the last 8760 hours. Basic Metabolic Panel: Recent Labs  Lab 02/05/19 1842 02/06/19 0430 02/07/19 0839 02/08/19 0609 02/10/19 0529  NA 140 141 136 138 140  K 2.7* 2.9* 3.9 3.5 3.3*  CL 94* 102 103 102 104  CO2 30 25 23 23 25   GLUCOSE 116* 94 102* 80 94  BUN 17 12 10 11 10   CREATININE 1.13* 1.03* 1.04* 0.93 0.93  CALCIUM 9.6 8.2* 8.7* 8.7* 8.6*  MG  --  1.8  --   --   --    Liver Function Tests: Recent Labs  Lab 02/05/19 1842  AST 36  ALT 27  ALKPHOS 56  BILITOT 1.4*  PROT 7.7  ALBUMIN 4.1   Recent Labs  Lab 02/05/19 1842  LIPASE 28   No results for input(s): AMMONIA in the last 168 hours. CBC: Recent Labs  Lab 02/05/19 1842 02/06/19 0430 02/06/19 0908 02/08/19 0609  WBC 12.5* 10.4 8.8 6.5  NEUTROABS 9.3*  --   --  4.1  HGB 17.2* 14.8 13.6 13.7  HCT 51.8* 46.9* 42.7 43.2  MCV 89.9 94.2 93.8 92.9  PLT 239 213 200 225   Cardiac Enzymes: No results for input(s): CKTOTAL, CKMB, CKMBINDEX, TROPONINI in the last 168 hours. BNP: Invalid input(s): POCBNP CBG: Recent Labs  Lab 02/09/19 0408  02/09/19 0747 02/09/19 1145 02/09/19 1611 02/09/19 2000  GLUCAP 110* 73 86 77 131*   D-Dimer No results for input(s): DDIMER in the last 72 hours. Hgb A1c No results  for input(s): HGBA1C in the last 72 hours. Lipid Profile No results for input(s): CHOL, HDL, LDLCALC, TRIG, CHOLHDL, LDLDIRECT in the last 72 hours. Thyroid function studies No results for input(s): TSH, T4TOTAL, T3FREE, THYROIDAB in the last 72 hours.  Invalid input(s): FREET3 Anemia work up No results for input(s): VITAMINB12, FOLATE, FERRITIN, TIBC, IRON, RETICCTPCT in the last 72 hours. Urinalysis    Component Value Date/Time   COLORURINE YELLOW 02/05/2019 2249   APPEARANCEUR CLEAR 02/05/2019 2249   LABSPEC 1.010 02/05/2019 2249   PHURINE 5.5 02/05/2019 2249   GLUCOSEU >=500 (A) 02/05/2019 2249   HGBUR NEGATIVE 02/05/2019 2249   BILIRUBINUR NEGATIVE 02/05/2019 2249   KETONESUR NEGATIVE 02/05/2019 2249   PROTEINUR NEGATIVE 02/05/2019 2249   NITRITE NEGATIVE 02/05/2019 2249   LEUKOCYTESUR NEGATIVE 02/05/2019 2249   Sepsis Labs Invalid input(s): PROCALCITONIN,  WBC,  LACTICIDVEN Microbiology Recent Results (from the past 240 hour(s))  Blood culture (routine x 2)     Status: None   Collection Time: 02/05/19  6:40 PM   Specimen: BLOOD  Result Value Ref Range Status   Specimen Description   Final    BLOOD LEFT ANTECUBITAL Performed at [facility], [address] [place], [place] [address]    Special Requests   Final    BOTTLES DRAWN AEROBIC AND ANAEROBIC Blood Culture adequate volume Performed at [facility], [address] [place], [place] [address]    Culture   Final    NO GROWTH 5 DAYS Performed at [facility], [place] [address], [place], [place] [address]    Report Status 02/10/2019 FINAL  Final  SARS Coronavirus 2 Ag (30 min TAT) - Nasal Swab (BD Veritor Kit)     Status: None   Collection Time: 02/05/19  6:42 PM   Specimen: Nasal Swab (BD Veritor Kit)  Result Value Ref Range Status   SARS Coronavirus 2 Ag NEGATIVE NEGATIVE Final    Comment: (NOTE) SARS-CoV-2 antigen NOT DETECTED.  Negative results are presumptive.  Negative results  do not preclude SARS-CoV-2 infection and should not be used as the sole basis for treatment or other patient management decisions, including infection  control decisions, particularly in the presence of clinical signs and  symptoms consistent with COVID-19, or in those who have been in contact with the virus.  Negative results must be combined with clinical observations, patient history, and epidemiological information. The expected result is Negative. Fact Sheet for Patients: PodPark.tn Fact Sheet for Healthcare Providers: GiftContent.is This test is not yet approved or cleared by the [place] FDA and  has been authorized for detection and/or diagnosis of SARS-CoV-2 by FDA under an Emergency Use Authorization (EUA).  This EUA will remain in effect (meaning this test can be used) for the duration of  the COVID-19 de claration under Section 564(b)(1) of the Act, 21 U.S.C. section 360bbb-3(b)(1), unless the authorization is terminated or revoked sooner. Performed at [facility], [address] [place], [place] [address]   Blood culture (routine x 2)     Status: None   Collection Time: 02/05/19  6:42 PM   Specimen: BLOOD  Result Value Ref Range Status   Specimen Description   Final    BLOOD RIGHT ANTECUBITAL Performed at [facility]  [address] [place] [place], [place] [address]    Special Requests   Final    BOTTLES DRAWN AEROBIC AND ANAEROBIC Blood Culture adequate volume Performed at [facility], [address] [place], [place] [address]    Culture   Final    NO GROWTH 5 DAYS Performed at [facility], [place] [address], [place], [place] [address]    Report Status 02/10/2019 FINAL  Final  SARS CORONAVIRUS 2 (TAT 6-24 HRS) Nasopharyngeal Nasopharyngeal Swab     Status: None   Collection Time: 02/05/19  9:08 PM   Specimen: Nasopharyngeal Swab  Result Value Ref Range Status    SARS Coronavirus 2 NEGATIVE NEGATIVE Final    Comment: (NOTE) SARS-CoV-2 target nucleic acids are NOT DETECTED. The SARS-CoV-2 RNA is generally detectable in upper and lower respiratory specimens during the acute phase of infection. Negative results do not preclude SARS-CoV-2 infection, do not rule out co-infections with other pathogens, and should not be used as the sole basis for treatment or other patient management decisions. Negative results must be combined with clinical observations, patient history, and epidemiological information. The expected result is Negative. Fact Sheet for Patients: SugarRoll.be Fact Sheet for Healthcare Providers: https://www.woods-mathews.com/ This test is not yet approved or cleared by the [place] FDA and  has been authorized for detection and/or diagnosis of SARS-CoV-2 by FDA under an Emergency Use Authorization (EUA). This EUA will remain  in effect (meaning this test can be used) for the duration of the COVID-19 declaration under Section 56 4(b)(1) of the Act, 21 U.S.C. section 360bbb-3(b)(1), unless the authorization is terminated or revoked sooner. Performed at [facility], [place] [address], [place], [place] [address]   Urine culture     Status: None   Collection Time: 02/05/19 10:50 PM   Specimen: In/Out Cath Urine  Result Value Ref Range Status   Specimen Description   Final    IN/OUT CATH URINE Performed at [facility], [address] [place], [place] [address]    Special Requests   Final    NONE Performed at [facility], [address] [place], [place] [address]    Culture   Final    NO GROWTH Performed at [facility], [place] [address] [place], [place] [address]    Report Status 02/07/2019 FINAL  Final  C difficile quick scan w PCR reflex     Status: None   Collection Time: 02/06/19  5:24 PM   Specimen: STOOL  Result Value Ref Range Status   C  Diff antigen NEGATIVE NEGATIVE Final   C Diff toxin NEGATIVE NEGATIVE Final   C Diff interpretation No C. difficile detected.  Final    Comment: Performed at [facility], [place] [address], [place], [place] [address]  GI pathogen panel by PCR, stool     Status: None   Collection Time: 02/06/19  5:24 PM   Specimen: Stool  Result Value Ref Range Status   Plesiomonas shigelloides NOT DETECTED NOT DETECTED Final   Yersinia enterocolitica NOT DETECTED NOT DETECTED Final   Vibrio NOT DETECTED NOT DETECTED Final   Enteropathogenic E coli NOT DETECTED NOT DETECTED Final   E coli (ETEC) LT/ST NOT DETECTED NOT DETECTED Final   E coli 2202 by PCR Not applicable NOT DETECTED Final   Cryptosporidium by PCR NOT DETECTED NOT DETECTED Final   Entamoeba histolytica NOT DETECTED NOT DETECTED Final  Adenovirus F 40/41 NOT DETECTED NOT DETECTED Final   Norovirus GI/GII NOT DETECTED NOT DETECTED Final   Sapovirus NOT DETECTED NOT DETECTED Final    Comment: (NOTE) Performed At: [facility] [place], [place] [id number] Rush Farmer MD ZE:[id number]    Vibrio cholerae NOT DETECTED NOT DETECTED Final   Campylobacter by PCR NOT DETECTED NOT DETECTED Final   Salmonella by PCR NOT DETECTED NOT DETECTED Final   E coli (STEC) NOT DETECTED NOT DETECTED Final   Enteroaggregative E coli NOT DETECTED NOT DETECTED Final   Shigella by PCR NOT DETECTED NOT DETECTED Final   Cyclospora cayetanensis NOT DETECTED NOT DETECTED Final   Astrovirus NOT DETECTED NOT DETECTED Final   G lamblia by PCR NOT DETECTED NOT DETECTED Final   Rotavirus A by PCR NOT DETECTED NOT DETECTED Final    Please note: You were cared for by a hospitalist during your hospital stay. Once you are discharged, your primary care physician will handle any further medical issues. Please note that NO REFILLS for any discharge medications will be authorized once you are discharged, as it is imperative that you  return to your primary care physician (or establish a relationship with a primary care physician if you do not have one) for your post hospital discharge needs so that they can reassess your need for medications and monitor your lab values.    Time coordinating discharge: 40 minutes  SIGNED:   Shelly Coss, MD  [facility] 02/10/2019, 12:21 PM Pager [phone]  If 7PM-7AM, please contact night-coverage [email or web address] Password [email or web address]

## 2019-05-09 ENCOUNTER — Ambulatory Visit: Payer: Medicare Other | Admitting: Podiatry

## 2019-05-20 ENCOUNTER — Other Ambulatory Visit: Payer: Self-pay | Admitting: Allergy and Immunology

## 2019-06-07 ENCOUNTER — Other Ambulatory Visit: Payer: Self-pay | Admitting: Allergy and Immunology

## 2019-07-04 ENCOUNTER — Telehealth: Payer: Self-pay | Admitting: Allergy and Immunology

## 2019-07-04 MED ORDER — DULERA 200-5 MCG/ACT IN AERO: INHALATION_SPRAY | RESPIRATORY_TRACT | 0 refills | Status: DC

## 2019-07-04 NOTE — Telephone Encounter (Signed)
Patient called to make an appointment with Dr. Verlin Fester in [place]. He does not have anything until mid August. She said she could not wait that long; I put her in with Webb Silversmith in [place] on 07/23/19. She needs a refill for [facility]. [facility] [address] in [place].

## 2019-07-04 NOTE — Telephone Encounter (Signed)
Courtesy refill has been sent in. Called and left a detailed voicemail per DPR permission advising.

## 2019-07-23 ENCOUNTER — Encounter: Payer: Self-pay | Admitting: Family Medicine

## 2019-07-23 ENCOUNTER — Other Ambulatory Visit: Payer: Self-pay

## 2019-07-23 ENCOUNTER — Ambulatory Visit (INDEPENDENT_AMBULATORY_CARE_PROVIDER_SITE_OTHER): Payer: Medicare Other | Admitting: Family Medicine

## 2019-07-23 VITALS — BP 118/78 | HR 75 | Temp 97.8°F | Resp 18 | Ht 59.0 in | Wt 138.9 lb

## 2019-07-23 DIAGNOSIS — J3089 Other allergic rhinitis: Secondary | ICD-10-CM | POA: Diagnosis not present

## 2019-07-23 DIAGNOSIS — K219 Gastro-esophageal reflux disease without esophagitis: Secondary | ICD-10-CM

## 2019-07-23 DIAGNOSIS — J4540 Moderate persistent asthma, uncomplicated: Secondary | ICD-10-CM

## 2019-07-23 MED ORDER — DULERA 200-5 MCG/ACT IN AERO: INHALATION_SPRAY | RESPIRATORY_TRACT | 1 refills | Status: DC

## 2019-07-23 MED ORDER — FLUTICASONE PROPIONATE 50 MCG/ACT NA SUSP: NASAL | 1 refills | Status: DC

## 2019-07-23 MED ORDER — MONTELUKAST SODIUM 10 MG PO TABS: ORAL_TABLET | ORAL | 1 refills | Status: DC

## 2019-07-23 NOTE — Patient Instructions (Addendum)
Asthma  Continue Dulera 200-2 puffs once or twice a day, depending on disease, to prevent cough or wheeze Continue montelukast 10 mg once a day to prevent cough or wheeze Continue albuterol 2 puffs every 4 hours as needed for cough or wheeze  Allergic rhinitis Continue allergen avoidance measures directed toward grass pollen, weed pollen, grass pollen, dust mite, cat hair, and cockroach Continue Flonase 2 sprays in each nostril once a day as needed for a stuffy nose Continue azelastine 2 sprays in each nostril twice a day as needed for a runny nose Continue saline nasal rinses as needed for nasal symptoms. Use this before any medicated nasal sprays for best result  Reflux Continue famotidine 20 mg twice a day and omeprazole 20 mg once a day to control reflux Continue dietary and lifestyle modifications as listed below If globus or the feeling of something in your throat continues or worsens, consider evaluation by ENT  Call the clinic if this treatment plan is not working well for you  Follow up in 6 months or sooner if needed.   Lifestyle Changes for Controlling GERD When you have GERD, stomach acid feels as if it's backing up toward your mouth. Whether or not you take medication to control your GERD, your symptoms can often be improved with lifestyle changes.   Raise Your Head  Reflux is more likely to strike when you're lying down flat, because stomach fluid can  flow backward more easily. Raising the head of your bed 4-6 inches can help. To do this:  Slide blocks or books under the legs at the head of your bed. Or, place a wedge under  the mattress. Many foam stores can make a suitable wedge for you. The wedge  should run from your waist to the top of your head.  Don't just prop your head on several pillows. This increases pressure on your  stomach. It can make GERD worse.  Watch Your Eating Habits Certain foods may increase the acid in your stomach or relax the lower  esophageal sphincter, making GERD more likely. It's best to avoid the following:  Coffee, tea, and carbonated drinks (with and without caffeine)  Fatty, fried, or spicy food  Mint, chocolate, onions, and tomatoes  Any other foods that seem to irritate your stomach or cause you pain  Relieve the Pressure  Eat smaller meals, even if you have to eat more often.  Don't lie down right after you eat. Wait a few hours for your stomach to empty.  Avoid tight belts and tight-fitting clothes.  Lose excess weight.  Tobacco and Alcohol  Avoid smoking tobacco and drinking alcohol. They can make GERD symptoms worse.  Reducing Pollen Exposure The American Academy of Allergy, Asthma and Immunology suggests the following steps to reduce your exposure to pollen during allergy seasons. 1. Do not hang sheets or clothing out to dry; pollen may collect on these items. 2. Do not mow lawns or spend time around freshly cut grass; mowing stirs up pollen. 3. Keep windows closed at night.  Keep car windows closed while driving. 4. Minimize morning activities outdoors, a time when pollen counts are usually at their highest. 5. Stay indoors as much as possible when pollen counts or humidity is high and on windy days when pollen tends to remain in the air longer. 6. Use air conditioning when possible.  Many air conditioners have filters that trap the pollen spores. 7. Use a HEPA room air filter to remove pollen form the  indoor air you breathe.  Control of Dog or Cat Allergen Avoidance is the best way to manage a dog or cat allergy. If you have a dog or cat but don't want to find it a new home, or if your family wants a pet even though someone in the household is allergic, here are some strategies that may help keep symptoms at bay:  8. Keep the pet out of your bedroom and restrict it to only a few rooms. Be advised that keeping the dog or cat in only one room will not limit the allergens to that  room. 9. Don't pet, hug or kiss the dog or cat; if you do, wash your hands with soap and water. 10. High-efficiency particulate air (HEPA) cleaners run continuously in a bedroom or living room can reduce allergen levels over time. 11. Regular use of a high-efficiency vacuum cleaner or a central vacuum can reduce allergen levels. 12. Giving your dog or cat a bath at least once a week can reduce airborne allergen.   Control of Dust Mite Allergen Dust mites play a major role in allergic asthma and rhinitis. They occur in environments with high humidity wherever human skin is found. Dust mites absorb humidity from the atmosphere (ie, they do not drink) and feed on organic matter (including shed human and animal skin). Dust mites are a microscopic type of insect that you cannot see with the naked eye. High levels of dust mites have been detected from mattresses, pillows, carpets, upholstered furniture, bed covers, clothes, soft toys and any woven material. The principal allergen of the dust mite is found in its feces. A gram of dust may contain 1,000 mites and 250,000 fecal particles. Mite antigen is easily measured in the air during house cleaning activities. Dust mites do not bite and do not cause harm to humans, other than by triggering allergies/asthma.  Ways to decrease your exposure to dust mites in your home:  1. Encase mattresses, box springs and pillows with a mite-impermeable barrier or cover  2. Wash sheets, blankets and drapes weekly in hot water (130 F) with detergent and dry them in a dryer on the hot setting.  3. Have the room cleaned frequently with a vacuum cleaner and a damp dust-mop. For carpeting or rugs, vacuuming with a vacuum cleaner equipped with a high-efficiency particulate air (HEPA) filter. The dust mite allergic individual should not be in a room which is being cleaned and should wait 1 hour after cleaning before going into the room.  4. Do not sleep on upholstered  furniture (eg, couches).  5. If possible removing carpeting, upholstered furniture and drapery from the home is ideal. Horizontal blinds should be eliminated in the rooms where the person spends the most time (bedroom, study, television room). Washable vinyl, roller-type shades are optimal.  6. Remove all non-washable stuffed toys from the bedroom. Wash stuffed toys weekly like sheets and blankets above.  7. Reduce indoor humidity to less than 50%. Inexpensive humidity monitors can be purchased at most hardware stores. Do not use a humidifier as can make the problem worse and are not recommended.  Control of Cockroach Allergen  Cockroach allergen has been identified as an important cause of acute attacks of asthma, especially in urban settings.  There are fifty-five species of cockroach that exist in the [place], however only three, the [place], [place] species produce allergen that can affect patients with Asthma.  Allergens can be obtained from fecal particles, egg casings and  secretions from cockroaches.    1. Remove food sources. 2. Reduce access to water. 3. Seal access and entry points. 4. Spray runways with 0.5-1% Diazinon or Chlorpyrifos 5. Blow boric acid power under stoves and refrigerator. 6. Place bait stations (hydramethylnon) at feeding sites.

## 2019-07-23 NOTE — Progress Notes (Addendum)
[address] [place] [address] Dept: [phone]  FOLLOW UP NOTE  Patient ID: Madison Ball, female    DOB: October 25, 1950  Age: 69 y.o. MRN: [id number] Date of Office Visit: 07/23/2019  Assessment  Chief Complaint: Allergic Rhinitis  and Asthma  HPI Madison Ball is a 69 year old female who presents to the clinic for follow-up visit.  She was last seen in this clinic on 11/13/2018 by Dr. Verlin Fester for evaluation of asthma with acute exacerbation, allergic rhinitis, reflux, and globus sensation.  At today's visit, she reports her asthma has been moderately well controlled with symptoms including shortness of breath occurring with a major rash, due to allergy when she is outside.  She denies shortness of breath, cough, and wheezing with activity.  She continues montelukast 10 mg once a day, and Dulera 200-2 puffs once a day and occasionally 2 puffs twice a day, and uses albuterol infrequently.  Allergic rhinitis is reported as well controlled with occasional drainage and frequent postnasal drainage.  She denies nasal congestion and sneezing.  She has recently started using Flonase nasal spray and saline nasal rinses daily with a significant improvement in post nasal drainage. She is not currently taking Mucinex or using azelastine nasal spray.  Reflux is reported as well controlled with famotidine 20 mg twice a day as well as omeprazole 20 mg once a day.  She reports that about once a month she gets a globus feeling or "constricted feeling" that lasts for a couple of days.  She reports this feeling has subsided significantly with the addition of famotidine.  She reports that she usually uses nystatin swish and swallow with resolution of the symptoms.  Her current medications are listed in the chart.   Drug Allergies:  Allergies  Allergen Reactions  . Emetine   . Erythromycin   . Reglan [Metoclopramide]   . Chlordiazepoxide Hcl Rash  . Librium [Chlordiazepoxide] Rash    Physical  Exam: BP 118/78 (BP Location: Right Arm, Patient Position: Sitting, Cuff Size: Normal)   Pulse 75   Temp 97.8 F (36.6 C) (Oral)   Resp 18   Ht 4' 11"  (1.499 m)   Wt 138 lb 14.2 oz (63 kg)   SpO2 95%   BMI 28.05 kg/m    Physical Exam Vitals reviewed.  Constitutional:      Appearance: Normal appearance.  HENT:     Head: Normocephalic and atraumatic.     Right Ear: Tympanic membrane normal.     Left Ear: Tympanic membrane normal.     Nose:     Comments: Bilateral nares normal.  Pharynx slightly erythematous with no exudate or white patches noted.  Ears normal.  Eyes normal. Eyes:     Conjunctiva/sclera: Conjunctivae normal.  Cardiovascular:     Rate and Rhythm: Normal rate and regular rhythm.     Heart sounds: Normal heart sounds. No murmur heard.   Pulmonary:     Effort: Pulmonary effort is normal.     Breath sounds: Normal breath sounds.     Comments: Lungs clear to auscultation Musculoskeletal:        General: Normal range of motion.     Cervical back: Normal range of motion and neck supple.  Skin:    General: Skin is warm and dry.  Neurological:     Mental Status: She is alert and oriented to person, place, and time.  Psychiatric:        Mood and Affect: Mood normal.  Behavior: Behavior normal.        Thought Content: Thought content normal.        Judgment: Judgment normal.     Diagnostics: FVC 2.00, FEV1 1.77.  Predicted FVC 1.80, predicted FEV1 1.38.  Spirometry indicates normal ventilatory function.  Assessment and Plan: 1. Moderate persistent asthma without complication   2. Allergic rhinitis   3. Gastroesophageal reflux disease without esophagitis     Meds ordered this encounter  Medications  . montelukast (SINGULAIR) 10 MG tablet    Sig: Take 1 tablet once daily at night for coughing or wheezing.    Dispense:  90 tablet    Refill:  1    Please dispense 90 day supply  . mometasone-formoterol (DULERA) 200-5 MCG/ACT AERO    Sig: 2 puffs  twice daily to prevent coughing or wheezing.    Dispense:  39 g    Refill:  1    Please dispense 90 day supply.  . fluticasone (FLONASE) 50 MCG/ACT nasal spray    Sig: 2 sprays per nostril once daily as needed for stuffy nose    Dispense:  48 mL    Refill:  1    Please dispense 90 day supply.    Patient Instructions  Asthma  Continue Dulera 200-2 puffs once or twice a day, depending on symptoms, to prevent cough or wheeze Continue montelukast 10 mg once a day to prevent cough or wheeze Continue albuterol 2 puffs every 4 hours as needed for cough or wheeze  Allergic rhinitis Continue allergen avoidance measures directed toward grass pollen, weed pollen, grass pollen, dust mite, cat hair, and cockroach Continue Flonase 2 sprays in each nostril once a day as needed for a stuffy nose Continue azelastine 2 sprays in each nostril twice a day as needed for a runny nose Continue saline nasal rinses as needed for nasal symptoms. Use this before any medicated nasal sprays for best result  Reflux Continue famotidine 20 mg twice a day and omeprazole 20 mg once a day to control reflux Continue dietary and lifestyle modifications as listed below If globus or the feeling of something stuck in your throat continues or worsens, consider evaluation by ENT  Call the clinic if this treatment plan is not working well for you  Follow up in 6 months or sooner if needed.   Return in about 6 months (around 01/22/2020), or if symptoms worsen or fail to improve.    Thank you for the opportunity to care for this patient.  Please do not hesitate to contact me with questions.  Gareth Morgan, FNP [facility] [place]  ________________________________________________  I have provided oversight concerning Webb Silversmith Amb's evaluation and treatment of this patient's health issues addressed during today's encounter.  I agree with the assessment and therapeutic plan as outlined in the note.     Signed,   R Edgar Frisk, MD

## 2019-07-24 ENCOUNTER — Other Ambulatory Visit (HOSPITAL_COMMUNITY)
Admission: RE | Admit: 2019-07-24 | Discharge: 2019-07-24 | Disposition: A | Discharge status: Home / Self Care | Payer: Medicare Other | Source: Ambulatory Visit | Attending: Family Medicine | Admitting: Family Medicine

## 2019-07-24 DIAGNOSIS — N898 Other specified noninflammatory disorders of vagina: Secondary | ICD-10-CM | POA: Diagnosis present

## 2019-07-25 LAB — MOLECULAR ANCILLARY ONLY
Bacterial Vaginitis (gardnerella): NEGATIVE
Candida Glabrata: POSITIVE — AB
Candida Vaginitis: POSITIVE — AB
Chlamydia: NEGATIVE
Comment: NEGATIVE
Comment: NEGATIVE
Comment: NEGATIVE
Comment: NEGATIVE
Comment: NEGATIVE
Comment: NORMAL
Neisseria Gonorrhea: NEGATIVE
Trichomonas: NEGATIVE

## 2020-01-13 ENCOUNTER — Other Ambulatory Visit: Payer: Self-pay | Admitting: Family Medicine

## 2020-01-28 ENCOUNTER — Ambulatory Visit: Payer: Medicare Other | Admitting: Allergy and Immunology

## 2020-01-28 ENCOUNTER — Ambulatory Visit (INDEPENDENT_AMBULATORY_CARE_PROVIDER_SITE_OTHER): Payer: Medicare Other | Admitting: Family Medicine

## 2020-01-28 ENCOUNTER — Other Ambulatory Visit: Payer: Self-pay

## 2020-01-28 ENCOUNTER — Encounter: Payer: Self-pay | Admitting: Family Medicine

## 2020-01-28 VITALS — BP 110/62 | HR 75 | Temp 96.1°F | Resp 16

## 2020-01-28 DIAGNOSIS — R053 Chronic cough: Secondary | ICD-10-CM | POA: Diagnosis not present

## 2020-01-28 DIAGNOSIS — J4540 Moderate persistent asthma, uncomplicated: Secondary | ICD-10-CM

## 2020-01-28 DIAGNOSIS — J3089 Other allergic rhinitis: Secondary | ICD-10-CM

## 2020-01-28 DIAGNOSIS — K219 Gastro-esophageal reflux disease without esophagitis: Secondary | ICD-10-CM | POA: Diagnosis not present

## 2020-01-28 NOTE — Progress Notes (Signed)
[address] [place] [address] Dept: [phone]  FOLLOW UP NOTE  Patient ID: Madison Ball, female    DOB: 11-Feb-1950  Age: 69 y.o. MRN: [id number] Date of Office Visit: 01/28/2020  Assessment  Chief Complaint: Cough (Dry cough )  HPI Madison Ball is a 69 year old female who presents to the clinic for follow-up visit.  She was last seen in this clinic on 07/23/2019 for evaluation of asthma, allergic rhinitis, reflux, and globus sensation.  At today's visit she reports her asthma has been well controlled with no shortness of breath or wheeze with activity or rest.  She does report cough which is dry most of the time and occasionally produces thick clear mucus.  She reports this feels as though she has a tickle in her throat which is worse while she is lying down.  She uses ginger mint and increases water intake to resolve the tickling sensation.  She continues Dulera 200-2 puffs twice in the morning, montelukast 10 mg once a day and last used her albuterol about 6 months ago.  Allergic rhinitis is reported as moderately well controlled with some clear rhinorrhea and moderate amount of throat clearing.  She continues saline rinses followed by Flonase daily and is not currently using azelastine.  Reflux is reported as well controlled with famotidine 20 mg twice a day and omeprazole 20 mg once a day 30 minutes before her first meal.    Drug Allergies:  Allergies  Allergen Reactions  . Emetine   . Erythromycin   . Metoclopramide Other (See Comments)    Reglan  . Other Other (See Comments)  . Chlordiazepoxide Hcl Rash  . Librium [Chlordiazepoxide] Rash    Physical Exam: BP 110/62   Pulse 75   Temp (!) 96.1 F (35.6 C) (Temporal)   Resp 16   SpO2 96%    Physical Exam Vitals reviewed.  Constitutional:      Appearance: Normal appearance.  HENT:     Head: Normocephalic and atraumatic.     Right Ear: Tympanic membrane normal.     Left Ear: Tympanic membrane normal.      Nose:     Comments: Bilateral nares slightly erythematous with clear nasal drainage noted.  Slight septal deviation.  Pharynx slightly erythematous with no exudate.  Ears normal.  Eyes normal. Eyes:     Conjunctiva/sclera: Conjunctivae normal.  Cardiovascular:     Rate and Rhythm: Normal rate and regular rhythm.     Heart sounds: Normal heart sounds. No murmur heard.   Pulmonary:     Effort: Pulmonary effort is normal.     Breath sounds: Normal breath sounds.     Comments: Lungs clear to auscultation Musculoskeletal:        General: Normal range of motion.     Cervical back: Normal range of motion and neck supple.  Skin:    General: Skin is warm and dry.  Neurological:     Mental Status: She is alert and oriented to person, place, and time.  Psychiatric:        Mood and Affect: Mood normal.        Behavior: Behavior normal.        Thought Content: Thought content normal.        Judgment: Judgment normal.     Diagnostics: FVC 1.85, FEV1 1.61.  Predicted FVC 1.80, predicted FEV1 1.38.  Spirometry indicates normal ventilatory function.  Assessment and Plan: 1. Moderate persistent asthma without complication   2.  Allergic rhinitis   3. Gastroesophageal reflux disease without esophagitis   4. Cough, persistent     Patient Instructions  Asthma  Continue Dulera 200-2 puffs once or twice a day, depending on disease, to prevent cough or wheeze Continue montelukast 10 mg once a day to prevent cough or wheeze Continue albuterol 2 puffs every 4 hours as needed for cough or wheeze  Allergic rhinitis Continue allergen avoidance measures directed toward grass pollen, weed pollen, grass pollen, dust mite, cat hair, and cockroach Continue Flonase 2 sprays in each nostril once a day as needed for a stuffy nose.  In the right nostril, point the applicator out toward the right ear. In the left nostril, point the applicator out toward the left ear Continue azelastine 2 sprays in each  nostril twice a day as needed for a runny nose Continue saline nasal rinses as needed for nasal symptoms. Use this before any medicated nasal sprays for best result If your symptoms do not improve, consider allergen immunotherapy  Reflux Increase omeprazole to 20 mg twice a day to control reflux. Take this medication 30 minutes before mealtime Continue famotidine 20 mg twice a day to control reflux Continue dietary and lifestyle modifications as listed below If the tickling feeling in your throat continues or worsens, consider evaluation by ENT  Call the clinic if this treatment plan is not working well for you  Follow up in 6 months or sooner if needed.   Return in about 6 months (around 07/28/2020), or if symptoms worsen or fail to improve.    Thank you for the opportunity to care for this patient.  Please do not hesitate to contact me with questions.  Gareth Morgan, FNP [facility] [place]

## 2020-01-28 NOTE — Patient Instructions (Addendum)
Asthma  Continue Dulera 200-2 puffs once or twice a day, depending on disease, to prevent cough or wheeze Continue montelukast 10 mg once a day to prevent cough or wheeze Continue albuterol 2 puffs every 4 hours as needed for cough or wheeze  Allergic rhinitis Continue allergen avoidance measures directed toward grass pollen, weed pollen, grass pollen, dust mite, cat hair, and cockroach Continue Flonase 2 sprays in each nostril once a day as needed for a stuffy nose.  In the right nostril, point the applicator out toward the right ear. In the left nostril, point the applicator out toward the left ear Continue azelastine 2 sprays in each nostril twice a day as needed for a runny nose Continue saline nasal rinses as needed for nasal symptoms. Use this before any medicated nasal sprays for best result If your symptoms do not improve, consider allergen immunotherapy  Reflux Increase omeprazole to 20 mg twice a day to control reflux. Take this medication 30 minutes before mealtime Continue famotidine 20 mg twice a day to control reflux Continue dietary and lifestyle modifications as listed below If the tickling feeling in your throat continues or worsens, consider evaluation by ENT  Call the clinic if this treatment plan is not working well for you  Follow up in 6 months or sooner if needed.   Lifestyle Changes for Controlling GERD When you have GERD, stomach acid feels as if it's backing up toward your mouth. Whether or not you take medication to control your GERD, your symptoms can often be improved with lifestyle changes.   Raise Your Head  Reflux is more likely to strike when you're lying down flat, because stomach fluid can  flow backward more easily. Raising the head of your bed 4-6 inches can help. To do this:  Slide blocks or books under the legs at the head of your bed. Or, place a wedge under  the mattress. Many foam stores can make a suitable wedge for you. The  wedge  should run from your waist to the top of your head.  Don't just prop your head on several pillows. This increases pressure on your  stomach. It can make GERD worse.  Watch Your Eating Habits Certain foods may increase the acid in your stomach or relax the lower esophageal sphincter, making GERD more likely. It's best to avoid the following:  Coffee, tea, and carbonated drinks (with and without caffeine)  Fatty, fried, or spicy food  Mint, chocolate, onions, and tomatoes  Any other foods that seem to irritate your stomach or cause you pain  Relieve the Pressure  Eat smaller meals, even if you have to eat more often.  Don't lie down right after you eat. Wait a few hours for your stomach to empty.  Avoid tight belts and tight-fitting clothes.  Lose excess weight.  Tobacco and Alcohol  Avoid smoking tobacco and drinking alcohol. They can make GERD symptoms worse.  Reducing Pollen Exposure The American Academy of Allergy, Asthma and Immunology suggests the following steps to reduce your exposure to pollen during allergy seasons. 1. Do not hang sheets or clothing out to dry; pollen may collect on these items. 2. Do not mow lawns or spend time around freshly cut grass; mowing stirs up pollen. 3. Keep windows closed at night.  Keep car windows closed while driving. 4. Minimize morning activities outdoors, a time when pollen counts are usually at their highest. 5. Stay indoors as much as possible when pollen counts or humidity is high  and on windy days when pollen tends to remain in the air longer. 6. Use air conditioning when possible.  Many air conditioners have filters that trap the pollen spores. 7. Use a HEPA room air filter to remove pollen form the indoor air you breathe.  Control of Dog or Cat Allergen Avoidance is the best way to manage a dog or cat allergy. If you have a dog or cat but don't want to find it a new home, or if your family wants a pet even though  someone in the household is allergic, here are some strategies that may help keep symptoms at bay:  8. Keep the pet out of your bedroom and restrict it to only a few rooms. Be advised that keeping the dog or cat in only one room will not limit the allergens to that room. 9. Don't pet, hug or kiss the dog or cat; if you do, wash your hands with soap and water. 10. High-efficiency particulate air (HEPA) cleaners run continuously in a bedroom or living room can reduce allergen levels over time. 11. Regular use of a high-efficiency vacuum cleaner or a central vacuum can reduce allergen levels. 12. Giving your dog or cat a bath at least once a week can reduce airborne allergen.   Control of Dust Mite Allergen Dust mites play a major role in allergic asthma and rhinitis. They occur in environments with high humidity wherever human skin is found. Dust mites absorb humidity from the atmosphere (ie, they do not drink) and feed on organic matter (including shed human and animal skin). Dust mites are a microscopic type of insect that you cannot see with the naked eye. High levels of dust mites have been detected from mattresses, pillows, carpets, upholstered furniture, bed covers, clothes, soft toys and any woven material. The principal allergen of the dust mite is found in its feces. A gram of dust may contain 1,000 mites and 250,000 fecal particles. Mite antigen is easily measured in the air during house cleaning activities. Dust mites do not bite and do not cause harm to humans, other than by triggering allergies/asthma.  Ways to decrease your exposure to dust mites in your home:  1. Encase mattresses, box springs and pillows with a mite-impermeable barrier or cover  2. Wash sheets, blankets and drapes weekly in hot water (130 F) with detergent and dry them in a dryer on the hot setting.  3. Have the room cleaned frequently with a vacuum cleaner and a damp dust-mop. For carpeting or rugs, vacuuming with a  vacuum cleaner equipped with a high-efficiency particulate air (HEPA) filter. The dust mite allergic individual should not be in a room which is being cleaned and should wait 1 hour after cleaning before going into the room.  4. Do not sleep on upholstered furniture (eg, couches).  5. If possible removing carpeting, upholstered furniture and drapery from the home is ideal. Horizontal blinds should be eliminated in the rooms where the person spends the most time (bedroom, study, television room). Washable vinyl, roller-type shades are optimal.  6. Remove all non-washable stuffed toys from the bedroom. Wash stuffed toys weekly like sheets and blankets above.  7. Reduce indoor humidity to less than 50%. Inexpensive humidity monitors can be purchased at most hardware stores. Do not use a humidifier as can make the problem worse and are not recommended.  Control of Cockroach Allergen  Cockroach allergen has been identified as an important cause of acute attacks of asthma, especially in urban  settings.  There are fifty-five species of cockroach that exist in the [place], however only three, the [place], [place] species produce allergen that can affect patients with Asthma.  Allergens can be obtained from fecal particles, egg casings and secretions from cockroaches.    1. Remove food sources. 2. Reduce access to water. 3. Seal access and entry points. 4. Spray runways with 0.5-1% Diazinon or Chlorpyrifos 5. Blow boric acid power under stoves and refrigerator. 6. Place bait stations (hydramethylnon) at feeding sites.

## 2020-01-31 ENCOUNTER — Telehealth: Payer: Self-pay

## 2020-01-31 NOTE — Telephone Encounter (Signed)
-----   Message from Dara Hoyer, FNP sent at 01/28/2020  2:06 PM EST ----- Can you please refer this patient to an ENT in [facility] for evaluation of cough with globus sensation despite aggressive treatment. Thank you

## 2020-02-04 ENCOUNTER — Other Ambulatory Visit: Payer: Self-pay | Admitting: Family Medicine

## 2020-02-05 NOTE — Telephone Encounter (Signed)
Left message for pt, Referral place & appt scheduled for 02/13/19 at 8:20am w/Dr Laurance Flatten at [place], [address], [place]. Ph [phone] Fax [phone]. Please bring insurance card and please arrive 15 mins

## 2020-03-24 ENCOUNTER — Other Ambulatory Visit: Payer: Self-pay | Admitting: Family Medicine

## 2020-03-24 ENCOUNTER — Other Ambulatory Visit: Payer: Self-pay

## 2020-03-24 ENCOUNTER — Ambulatory Visit
Admission: RE | Admit: 2020-03-24 | Discharge: 2020-03-24 | Disposition: A | Discharge status: Home / Self Care | Payer: Medicare Other | Source: Ambulatory Visit | Attending: Family Medicine | Admitting: Family Medicine

## 2020-03-24 DIAGNOSIS — M5418 Radiculopathy, sacral and sacrococcygeal region: Secondary | ICD-10-CM

## 2020-07-08 ENCOUNTER — Other Ambulatory Visit: Payer: Self-pay | Admitting: Family Medicine

## 2020-08-05 ENCOUNTER — Other Ambulatory Visit: Payer: Self-pay | Admitting: Family Medicine

## 2020-08-28 ENCOUNTER — Other Ambulatory Visit: Payer: Self-pay | Admitting: Family Medicine

## 2020-10-06 ENCOUNTER — Other Ambulatory Visit: Payer: Self-pay | Admitting: Family Medicine

## 2020-11-03 ENCOUNTER — Other Ambulatory Visit: Payer: Self-pay

## 2020-11-03 ENCOUNTER — Ambulatory Visit (INDEPENDENT_AMBULATORY_CARE_PROVIDER_SITE_OTHER): Payer: Medicare Other | Admitting: Allergy

## 2020-11-03 ENCOUNTER — Encounter: Payer: Self-pay | Admitting: Allergy

## 2020-11-03 VITALS — BP 116/76 | HR 74 | Temp 97.8°F | Resp 17 | Ht 61.0 in | Wt 137.4 lb

## 2020-11-03 DIAGNOSIS — K219 Gastro-esophageal reflux disease without esophagitis: Secondary | ICD-10-CM | POA: Diagnosis not present

## 2020-11-03 DIAGNOSIS — J4540 Moderate persistent asthma, uncomplicated: Secondary | ICD-10-CM

## 2020-11-03 DIAGNOSIS — J3089 Other allergic rhinitis: Secondary | ICD-10-CM

## 2020-11-03 MED ORDER — ALBUTEROL SULFATE HFA 108 (90 BASE) MCG/ACT IN AERS: 2.0000 | INHALATION_SPRAY | RESPIRATORY_TRACT | 2 refills | Status: AC | PRN

## 2020-11-03 MED ORDER — MONTELUKAST SODIUM 10 MG PO TABS: ORAL_TABLET | ORAL | 3 refills | Status: DC

## 2020-11-03 MED ORDER — DULERA 200-5 MCG/ACT IN AERO: INHALATION_SPRAY | RESPIRATORY_TRACT | 1 refills | Status: DC

## 2020-11-03 MED ORDER — IPRATROPIUM BROMIDE 0.06 % NA SOLN: 2.0000 | Freq: Four times a day (QID) | NASAL | 3 refills | Status: DC | PRN

## 2020-11-03 NOTE — Patient Instructions (Addendum)
Asthma  Continue Dulera 200-2 puffs once a day.   If you are not meeting the below goals or having respiratory illness then increase to 2 puffs twice a day Continue montelukast 10 mg once a day to prevent cough or wheeze Continue albuterol 2 puffs every 4 hours as needed for cough or wheeze  Asthma control goals:  Full participation in all desired activities (may need albuterol before activity) Albuterol use two time or less a week on average (not counting use with activity) Cough interfering with sleep two time or less a month Oral steroids no more than once a year No hospitalizations   Allergic rhinitis Continue allergen avoidance measures directed toward grass pollen, weed pollen, grass pollen, dust mite, cat hair, and cockroach Continue Montelukast as above Continue Zyrtec as needed Use nasal Atrovent 2 sprays each nostril twice a day as needed for nasal drainage/congestion. Can be used up to 4 times a day as needed for nasal drainage If Atrovent is not effective enough in controlling nasal congestion can use your Flonase as needed Recommend saline nasal rinses as needed for nasal symptoms. Use this before any medicated nasal sprays for best result If your symptoms do not improve, consider allergen immunotherapy  Reflux Continue your current regimen with Omeprazole/Prilosec and Famotidine  Continue dietary and lifestyle modifications as previously discussed  Call the clinic if this treatment plan is not working well for you  Follow up in 3-4 months or sooner if needed.

## 2020-11-03 NOTE — Progress Notes (Signed)
Follow-up Note  RE: Madison Ball MRN: [id number] DOB: 11-09-1950 Date of Office Visit: 11/03/2020   History of present illness: Madison Ball is a 70 y.o. female presenting today for follow-up of asthma, allergic rhinitis, reflux.  She was last seen in the office on 01/28/20 by our nurse practitioner Madison Ball.    She has had a cough now for years and states when it is at its worst she has difficulty breathing.  She keeps hard candy available to help with cough throughout the day.  She states she has to take delsym at the start of her work day as she does talk a lot that can drive coughing.  She does take Dulera 2 puffs once a day in AM that she states does help with her symptoms.  She ran out of singulair over last couple days and can tell a difference.  She uses albuterol very infrequently and recalls one use in past 4-5 months. She has not needed ED/UC visits since last visit and has not had systemic steroids for asthma.    She does note more nasal drainage.  She is using flonase she believes at home.  She states currently only has one nasal spray and she does not notice a taste.  She uses zyrtec as needed but has to plan when used as it makes her sluggish.  She states her dermatologist also recommended that she use zyrtec for her skin issue.   She takes omeprazole and pepcid at night and takes brand priloec in the morning for reflux control.      Review of systems: Review of Systems  Constitutional: Negative.   HENT:         See HPI  Eyes: Negative.   Respiratory:  Positive for cough.        See HPI  Cardiovascular: Negative.   Gastrointestinal: Negative.   Musculoskeletal: Negative.   Skin: Negative.   Neurological: Negative.    All other systems negative unless noted above in HPI  Past medical/social/surgical/family history have been reviewed and are unchanged unless specifically indicated below.  No changes  Medication List: Current Outpatient Medications  Medication  Sig Dispense Refill   albuterol (PROAIR HFA) 108 (90 Base) MCG/ACT inhaler Inhale 2 puffs into the lungs every 4 (four) hours as needed for wheezing or shortness of breath. 1 Inhaler 2   azelastine (ASTELIN) 0.1 % nasal spray Place 1-2 sprays into both nostrils 2 (two) times daily as needed for rhinitis. 30 mL 5   diclofenac Sodium (VOLTAREN) 1 % GEL SMARTSIG:2 Inch(es) Topical 4 Times Daily     famotidine (PEPCID) 20 MG tablet Take 1 tablet (20 mg total) by mouth 2 (two) times daily. (Patient taking differently: Take 20 mg by mouth daily.) 60 tablet 5   fluticasone (FLONASE) 50 MCG/ACT nasal spray 2 SPRAYS PER NOSTRIL ONCE DAILY AS NEEDED FOR STUFFY NOSE 48 mL 1   JARDIANCE 10 MG TABS tablet 10 mg daily.      metFORMIN (GLUCOPHAGE) 500 MG tablet Take 1 tablet by mouth Daily.     mometasone-formoterol (DULERA) 200-5 MCG/ACT AERO 2 puffs twice daily to prevent coughing or wheezing. 39 g 1   montelukast (SINGULAIR) 10 MG tablet TAKE 1 TABLET ONCE DAILY AT NIGHT FOR COUGHING OR WHEEZING. 30 tablet 0   omeprazole (PRILOSEC) 20 MG capsule Take 1 capsule (20 mg total) by mouth daily. 30 capsule 2   potassium chloride (KLOR-CON) 10 MEQ tablet Take 10 mEq by mouth 2 (  two) times daily.     valACYclovir (VALTREX) 500 MG tablet Take 500 mg by mouth 2 (two) times daily.     Vitamin E 400 UNITS TABS Take 2 tablets by mouth daily.     VYTORIN 10-40 MG per tablet Take 1 tablet by mouth Daily.     No current facility-administered medications for this visit.     Known medication allergies: Allergies  Allergen Reactions   Emetine    Erythromycin    Metoclopramide Other (See Comments)    Reglan   Other Other (See Comments)   Chlordiazepoxide Hcl Rash   Librium [Chlordiazepoxide] Rash     Physical examination: Blood pressure 116/76, pulse 74, temperature 97.8 F (36.6 C), temperature source Temporal, resp. rate 17, height 5' 1"  (1.549 m), weight 137 lb 6.4 oz (62.3 kg), SpO2 98 %.  General: Alert,  interactive, in no acute distress. HEENT: PERRLA, TMs pearly gray, turbinates mildly edematous with clear discharge, post-pharynx non erythematous. Neck: Supple without lymphadenopathy. Lungs: Clear to auscultation without wheezing, rhonchi or rales. {no increased work of breathing. CV: Normal S1, S2 without murmurs. Abdomen: Nondistended, nontender. Skin: Warm and dry, without lesions or rashes. Extremities:  No clubbing, cyanosis or edema. Neuro:   Grossly intact.  Diagnositics/Labs:  Spirometry: FEV1: 1.43L 86%, FVC: 1.73L 82%, ratio consistent with nonobstructive pattern  Assessment and plan:   Asthma  Continue Dulera 200-2 puffs once a day.   If you are not meeting the below goals or having respiratory illness then increase to 2 puffs twice a day Continue montelukast 10 mg once a day to prevent cough or wheeze Continue albuterol 2 puffs every 4 hours as needed for cough or wheeze  Asthma control goals:  Full participation in all desired activities (may need albuterol before activity) Albuterol use two time or less a week on average (not counting use with activity) Cough interfering with sleep two time or less a month Oral steroids no more than once a year No hospitalizations   Allergic rhinitis Continue allergen avoidance measures directed toward grass pollen, weed pollen, grass pollen, dust mite, cat hair, and cockroach Continue Montelukast as above Continue Zyrtec as needed Use nasal Atrovent 2 sprays each nostril twice a day as needed for nasal drainage/congestion. Can be used up to 4 times a day as needed for nasal drainage If Atrovent is not effective enough in controlling nasal congestion can use your Flonase as needed Recommend saline nasal rinses as needed for nasal symptoms. Use this before any medicated nasal sprays for best result If your symptoms do not improve, consider allergen immunotherapy  Reflux Continue your current regimen with Omeprazole/Prilosec and  Famotidine  Continue dietary and lifestyle modifications as previously discussed  Call the clinic if this treatment plan is not working well for you  Follow up in 3-4 months or sooner if needed.  I appreciate the opportunity to take part in Madison Ball's care. Please do not hesitate to contact me with questions.  Sincerely,   Prudy Feeler, MD Allergy/Immunology [facility] [place] [facility] [place]

## 2021-01-21 ENCOUNTER — Other Ambulatory Visit: Payer: Self-pay | Admitting: Allergy

## 2021-01-26 ENCOUNTER — Ambulatory Visit: Payer: TRICARE For Life (TFL) | Admitting: Internal Medicine

## 2021-01-26 ENCOUNTER — Ambulatory Visit (INDEPENDENT_AMBULATORY_CARE_PROVIDER_SITE_OTHER): Payer: Medicare Other | Admitting: Internal Medicine

## 2021-01-26 ENCOUNTER — Encounter: Payer: Self-pay | Admitting: Internal Medicine

## 2021-01-26 ENCOUNTER — Other Ambulatory Visit: Payer: Self-pay

## 2021-01-26 VITALS — BP 116/76 | HR 80 | Temp 97.5°F | Resp 20

## 2021-01-26 DIAGNOSIS — K219 Gastro-esophageal reflux disease without esophagitis: Secondary | ICD-10-CM | POA: Diagnosis not present

## 2021-01-26 DIAGNOSIS — J3089 Other allergic rhinitis: Secondary | ICD-10-CM | POA: Diagnosis not present

## 2021-01-26 DIAGNOSIS — J4540 Moderate persistent asthma, uncomplicated: Secondary | ICD-10-CM

## 2021-01-26 MED ORDER — GABAPENTIN 100 MG PO CAPS: ORAL_CAPSULE | ORAL | 3 refills | Status: DC

## 2021-01-26 NOTE — Progress Notes (Signed)
FOLLOW UP Date of Service/Encounter:  01/26/21   Subjective:  Madison Ball (DOB: 1950/05/11) is a 70 y.o. female who returns to the [facility] on 01/26/2021 in re-evaluation of the following: asthma, allergic rhinitis and reflux History obtained from: chart review and patient.  For Review, LV was on 11/03/20  with Dr. Nelva Bush seen for asthma, rhinitis, GERD.    Today is a regular follow up visit.  1) Asthma - Asthma: Since last visit symptoms have stayed the same most persistent symptom is her cough.  She feels like asthma is otherwise well controlled and cough is separate from asthma.  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 -Up-to-date with pneumonia, Covid-19, and Flu vaccines. -Smoking exposure: denies  -Today's Asthma Control Test: 22/22 -Current Regimen: Dulera 236mg 2 puffs, Montelukast 164m Albuterol PRN  -Previous FEV1 86% , 1.43L at last visit -Total corticosteroid use in past year 0 -Adverse effects of medications : none -Dexa and Cataract Screening: not indicated   2) Seasonal and Perennial  Rhinitis: current therapy: Montelukast 1063mZyrtec as needed, Atrovent as needed, Flonase as needed  symptoms improved symptoms include: nasal congestion, rhinorrhea, post nasal drainage, sneezing, watery eyes, itchy eyes, and itchy nose- all well controlled with current medical regimen.   Previous allergy testing: yes : grass, weed, dust mite, cat, cockroach History of reflux/heartburn: yes Interested in Allergy Immunotherapy: no  3) Reflux:  Symptoms are well controlled with omeprazole twice a day and pepcid at night.  Denies any hoarseness, bad taste in  mouth or heartburn symptoms.    Allergies as of 01/26/2021       Reactions   Emetine    Erythromycin    Metoclopramide Other (See Comments)   Reglan   Other Other (See Comments)   Chlordiazepoxide Hcl Rash   Librium [chlordiazepoxide] Rash        Medication List        Accurate as of January 26, 2021  1:03 PM. If you have any questions, ask your nurse or doctor.          STOP taking these medications    metFORMIN 500 MG tablet Commonly known as: GLUCOPHAGE Stopped by: EveRoney MarionD       TAKE these medications    albuterol 108 (90 Base) MCG/ACT inhaler Commonly known as: ProAir HFA Inhale 2 puffs into the lungs every 4 (four) hours as needed for wheezing or shortness of breath.   azelastine 0.1 % nasal spray Commonly known as: ASTELIN Place 1-2 sprays into both nostrils 2 (two) times daily as needed for rhinitis.   diclofenac Sodium 1 % Gel Commonly known as: VOLTAREN SMARTSIG:2 Inch(es) Topical 4 Times Daily   Dulera 200-5 MCG/ACT Aero Generic drug: mometasone-formoterol 2 puffs twice daily to prevent coughing or wheezing.   famotidine 20 MG tablet Commonly known as: Pepcid Take 1 tablet (20 mg total) by mouth 2 (two) times daily. What changed: when to take this   fluticasone 50 MCG/ACT nasal spray Commonly known as: FLONASE 2 SPRAYS PER NOSTRIL ONCE DAILY AS NEEDED FOR STUFFY NOSE   gabapentin 100 MG capsule Commonly  known as: Neurontin take 1 tablet at night, as it can make you sleepy. Started by: Roney Marion, MD   ipratropium 0.06 % nasal spray Commonly known as: ATROVENT Place 2 sprays into both nostrils 4 (four) times daily as needed (Nasal drainage/congestion).   Jardiance 10 MG Tabs tablet Generic drug: empagliflozin 10 mg daily.   montelukast 10 MG tablet Commonly known as: SINGULAIR TAKE 1 TABLET ONCE A DAY AT NIGHT FOR COUGHING OR WHEEZING.   omeprazole 20 MG capsule Commonly known as: PriLOSEC Take 1 capsule (20  mg total) by mouth daily.   potassium chloride 10 MEQ tablet Commonly known as: KLOR-CON Take 10 mEq by mouth 2 (two) times daily.   valACYclovir 500 MG tablet Commonly known as: VALTREX Take 500 mg by mouth 2 (two) times daily.   Vitamin E 400 units Tabs Take 2 tablets by mouth daily.   Vytorin 10-40 MG tablet Generic drug: ezetimibe-simvastatin Take 1 tablet by mouth Daily.       Past Medical History:  Diagnosis Date   Asthma    Blood clot in vein    Diabetes insipidus ([place])    Eczema    Hypercholesterolemia    Recurrent upper respiratory infection (URI)    Past Surgical History:  Procedure Laterality Date   ANKLE SURGERY Right    TOTAL ABDOMINAL HYSTERECTOMY  1990   Otherwise, there have been no changes to her past medical history, surgical history, family history, or social history.  ROS: All others negative except as noted per HPI.   Objective:  BP 116/76    Pulse 80    Temp (!) 97.5 F (36.4 C) (Temporal)    Resp 20    SpO2 98%  There is no height or weight on file to calculate BMI. Physical Exam: General Appearance:  Alert, cooperative, no distress, appears stated age  Head:  Normocephalic, without obvious abnormality, atraumatic  HEENT  Conjunctiva clear, EOM's intact, TM- intact bilaterally, nasal mucosa pale without rhinnorhea,    Nasal polyposis not noted on limited external exam   Throat: Lips, tongue normal; teeth and gums normal, oral mucosa normal without exudates, posterior pharyngeal cobblestoning not noted  Neck: Supple, symmetrical  Lungs:   Respirations unlabored,  +coughing, Breath Sounds bilaterally, no wheeze, crackles or rales  Heart:  Appears well perfused, S1 S2 normal, no murmurs, rubs or gallops, regular rate or rhythm  Extremities: No edema  Skin: Skin color, texture, turgor normal, no rashes or lesions on visualized portions of skin  Neurologic: No gross deficits   Reviewed: Previous allergy encounters, testing, PFTS, allergy  pertinent laboratory and radiographic data   I reviewed her past medical history, social history, family history, and environmental history and no significant changes have been reported from her previous visit.  Spirometry:  Tracings reviewed. Her effort: Good reproducible efforts. FVC: 1.68L FEV1: 1.47L, 89% predicted FEV1/FVC ratio: 88% Interpretation: Spirometry consistent with normal pattern.  Please see scanned spirometry results for details.  Skin Testing:   Assessment:  Moderate persistent asthma without complication  Allergic rhinitis  Gastroesophageal reflux disease without esophagitis  Plan/Recommendations:   Patient Instructions  Moderate persistent asthma without complication - I think you asthma is well controlled and we don't need to step up asthma therapy but target the cough hypersensitivity syndrome instead   PLAN: Start Gabapentin 135m - take a night as it can make you sleepy  - . - Daily controller medication(s):  Dulera 2029m 2 puffs twice daily, Montelukast 1037maily  -  Prior to physical activity: albuterol 2 puffs 10-15 minutes before physical activity. - Rescue medications: albuterol 4 puffs every 4-6 hours as needed - Changes during respiratory infections or worsening symptoms: Increase Dulera 200 to 4 puffs twice daily for TWO WEEKS. - Get Influenza Vaccine and appropriate Pneumonia and COVID 19 boosters  - Asthma control goals:  * Full participation in all desired activities (may need albuterol before activity) * Albuterol use two time or less a week on average (not counting use with activity) * Cough interfering with sleep two time or less a month * Oral steroids no more than once a year * No hospitalizations   Allergic Rhinitis  - Continue Avoidance measures   - Astelin 1 spray per nostril twice a day, Can add on Flonase as needed for stuffy nose and Atrovent as needed for runny nose  - Continue Montelukast 81m daily  - Continue Allegra and  zyrtec as needed  - You can use an extra dose of the antihistamine, if needed, for breakthrough symptoms.  - Consider nasal saline rinses 1-2 times daily to remove allergens from the nasal cavities as well as help with mucous clearance (this is especially helpful to do before the nasal sprays are given) - Consider allergy shots as a means of long-term control and can reduce lifetime use of medications  - Allergy shots "re-train" and "reset" the immune system to ignore environmental allergens and decrease the resulting immune response to those allergens (sneezing, itchy watery eyes, runny nose, nasal congestion, etc).    - Allergy shots improve symptoms in 75-85%  - Allergy shots are the only potential permanent and disease modifying option  - We can discuss more at the next appointment if the medications are not working for you.  Reflux  - Continue your current regimen with Omeprazole/Prilosec and Famotidine  - Continue dietary and lifestyle modifications as previously discussed   FOLLOW UP: 4-6 weeks   Thank you so much for letting me partake in your care today.  Don't hesitate to reach out if you have any additional concerns!  ERoney Marion MD  [facility]

## 2021-01-26 NOTE — Patient Instructions (Addendum)
Moderate persistent asthma without complication - I think you asthma is well controlled and we don't need to step up asthma therapy but target the cough hypersensitivity syndrome instead   PLAN: Start Gabapentin 160m - take a night as it can make you sleepy  - . - Daily controller medication(s):  Dulera 2020m 2 puffs twice daily, Montelukast 1063maily  - Prior to physical activity: albuterol 2 puffs 10-15 minutes before physical activity. - Rescue medications: albuterol 4 puffs every 4-6 hours as needed - Changes during respiratory infections or worsening symptoms: Increase Dulera 200 to 4 puffs twice daily for TWO WEEKS. - Get Influenza Vaccine and appropriate Pneumonia and COVID 19 boosters  - Asthma control goals:  * Full participation in all desired activities (may need albuterol before activity) * Albuterol use two time or less a week on average (not counting use with activity) * Cough interfering with sleep two time or less a month * Oral steroids no more than once a year * No hospitalizations   Allergic Rhinitis  - Continue Avoidance measures   - Astelin 1 spray per nostril twice a day, Can add on Flonase as needed for stuffy nose and Atrovent as needed for runny nose  - Continue Montelukast 8m70mily  - Continue Allegra and zyrtec as needed  - You can use an extra dose of the antihistamine, if needed, for breakthrough symptoms.  - Consider nasal saline rinses 1-2 times daily to remove allergens from the nasal cavities as well as help with mucous clearance (this is especially helpful to do before the nasal sprays are given) - Consider allergy shots as a means of long-term control and can reduce lifetime use of medications  - Allergy shots "re-train" and "reset" the immune system to ignore environmental allergens and decrease the resulting immune response to those allergens (sneezing, itchy watery eyes, runny nose, nasal congestion, etc).    - Allergy shots improve symptoms in  75-85%  - Allergy shots are the only potential permanent and disease modifying option  - We can discuss more at the next appointment if the medications are not working for you.  Reflux  - Continue your current regimen with Omeprazole/Prilosec and Famotidine  - Continue dietary and lifestyle modifications as previously discussed   FOLLOW UP: 4-6 weeks   Thank you so much for letting me partake in your care today.  Don't hesitate to reach out if you have any additional concerns!  Madison Ball  [facility]

## 2021-01-27 NOTE — Addendum Note (Signed)
Addended by: Marcos Eke on: 01/27/2021 04:31 PM   Modules accepted: Orders

## 2021-03-30 ENCOUNTER — Ambulatory Visit: Payer: Medicare Other | Admitting: Internal Medicine

## 2021-03-30 NOTE — Patient Instructions (Incomplete)
Moderate persistent asthma without complication -    PLAN:  - . - Daily controller medication(s):  Dulera 249mg 2 puffs twice daily, Montelukast 143mdaily, gabapentin 10064mt [facility] activity: albuterol 2 puffs 10-15 minutes before physical activity. - Rescue medications: albuterol 4 puffs every 4-6 hours as needed - Changes during respiratory infections or worsening symptoms: Increase Dulera 200 to 4 puffs twice daily for TWO WEEKS. - Get Influenza Vaccine and appropriate Pneumonia and COVID 19 boosters  - Asthma control goals:  * Full participation in all desired activities (may need albuterol before activity) * Albuterol use two time or less a week on average (not counting use with activity) * Cough interfering with sleep two time or less a month * Oral steroids no more than once a year * No hospitalizations    Allergic Rhinitis  - Continue Avoidance measures    - Continue Montelukast 66m96mily, Astelin nasal spray twice a day, Flonase and Atrovent nasal spray as needed - Continue Allegra and zyrtec as needed     Reflux  - Continue your current regimen with Omeprazole/Prilosec and Famotidine  - Continue dietary and lifestyle modifications as previously discussed

## 2021-03-30 NOTE — Progress Notes (Unsigned)
Follow Up Note  RE: Madison Ball MRN: [id number] DOB: [birth year] Date of Office Visit: 03/30/2021  Referring provider: Lucianne Lei, MD Primary care provider: Lucianne Lei, MD  Chief Complaint: No chief complaint on file.  History of Present Illness: I had the pleasure of seeing Madison Ball for a follow up visit at the [facility] on 03/30/2021. She is a 71 y.o. female, who is being followed for asthma, allergic rhinitis and reflux. Her previous allergy office visit was on 01/26/2021 with Dr. Edison Pace. Today is a regular follow up visit.  1) asthma - Asthma: Since last visit symptoms have {Blank single:19197::" significantly improved",""somewhat improved","stayed the same","worsened"} -In the past month reports daytime symptoms *** per week  and nighttime symptoms  *** per week  -Limitations to daily activity: {Blank single:19197::"none","mild","some","severe"} - 0 ED visit, 0 UC visits 0 hospitalizations since last visit - 0 oral steroids and 0 antibiotics for airway illness since last visit. -Up-to-date with pneumonia, Covid-19, and Flu vaccines. -Smoking exposure: denies -Today's Asthma Control Test:  .   -Current Regimen: Dulera 218mg 2 puffs, Montelukast 155m Albuterol PRN  -Previous FEV1 % *** at last visit -Total corticosteroid use in past year *** -Adverse effects of medications : none -Dexa and Cataract Screening: not indicated  2) seasonal and perennial rhinitis: current therapy: Singulair 10 mg daily Zyrtec as needed, Astelin nasal spray, ipratropium nasal spray as needed, Flonase as needed,  symptoms {Blank single:19197::"improved","partially improved","not improved"} symptoms include: {Blank multiple:19196:a:"***","nasal congestion","rhinorrhea","post nasal drainage","sneezing","watery eyes","itchy eyes","itchy nose"} Previous allergy testing: yes positive to grass, weed, dust mite, cat, cockroach History of reflux/heartburn: yes Interested in  Allergy Immunotherapy: {Blank single:19197::"yes","no"}  3) Cough Hypersensitivity Syndrome: At last visit she was started on gabapentin 100 mg daily for cough hypersensitivity syndrome.  Today she reports  4) GERD: Patient on omeprazole 20 mg twice daily and Pepcid at night.  She reports  Assessment and Plan: CoSelines a [age].o. female with: No diagnosis found. Plan: There are no Patient Instructions on file for this visit. No follow-ups on file.  No orders of the defined types were placed in this encounter.   Lab Orders  No laboratory test(s) ordered today   Diagnostics: Spirometry:  Tracings reviewed. Her effort: {Blank single:19197::"Good reproducible efforts.","It was hard to get consistent efforts and there is a question as to whether this reflects a maximal maneuver.","Poor effort, data can not be interpreted."} FVC: ***L FEV1: ***L, ***% predicted FEV1/FVC ratio: ***% Interpretation: {Blank single:19197::"Spirometry consistent with mild obstructive disease","Spirometry consistent with moderate obstructive disease","Spirometry consistent with severe obstructive disease","Spirometry consistent with possible restrictive disease","Spirometry consistent with mixed obstructive and restrictive disease","Spirometry uninterpretable due to technique","Spirometry consistent with normal pattern","No overt abnormalities noted given today's efforts"}.  Please see scanned spirometry results for details.  Skin Testing: {Blank single:19197::"Select foods","Environmental allergy panel","Environmental allergy panel and select foods","Food allergy panel","None","Deferred due to recent antihistamines use"}. *** Results interpreted by myself during this encounter and discussed with patient/family.   Medication List:  Current Outpatient Medications  Medication Sig Dispense Refill   albuterol (PROAIR HFA) 108 (90 Base) MCG/ACT inhaler Inhale 2 puffs into the lungs every 4 (four) hours as  needed for wheezing or shortness of breath. 1 each 2   azelastine (ASTELIN) 0.1 % nasal spray Place 1-2 sprays into both nostrils 2 (two) times daily as needed for rhinitis. 30 mL 5   diclofenac Sodium (VOLTAREN) 1 % GEL SMARTSIG:2 Inch(es) Topical 4 Times Daily     famotidine (PEPCID)  20 MG tablet Take 1 tablet (20 mg total) by mouth 2 (two) times daily. (Patient taking differently: Take 20 mg by mouth daily.) 60 tablet 5   fluticasone (FLONASE) 50 MCG/ACT nasal spray 2 SPRAYS PER NOSTRIL ONCE DAILY AS NEEDED FOR STUFFY NOSE (Patient not taking: Reported on 01/26/2021) 48 mL 1   gabapentin (NEURONTIN) 100 MG capsule take 1 tablet at night, as it can make you sleepy. 30 capsule 3   ipratropium (ATROVENT) 0.06 % nasal spray Place 2 sprays into both nostrils 4 (four) times daily as needed (Nasal drainage/congestion). (Patient not taking: Reported on 01/26/2021) 15 mL 3   JARDIANCE 10 MG TABS tablet 10 mg daily.      mometasone-formoterol (DULERA) 200-5 MCG/ACT AERO 2 puffs twice daily to prevent coughing or wheezing. 39 g 1   montelukast (SINGULAIR) 10 MG tablet TAKE 1 TABLET ONCE A DAY AT NIGHT FOR COUGHING OR WHEEZING. 90 tablet 0   omeprazole (PRILOSEC) 20 MG capsule Take 1 capsule (20 mg total) by mouth daily. 30 capsule 2   potassium chloride (KLOR-CON) 10 MEQ tablet Take 10 mEq by mouth 2 (two) times daily.     valACYclovir (VALTREX) 500 MG tablet Take 500 mg by mouth 2 (two) times daily. (Patient not taking: Reported on 01/26/2021)     Vitamin E 400 UNITS TABS Take 2 tablets by mouth daily.     VYTORIN 10-40 MG per tablet Take 1 tablet by mouth Daily.     No current facility-administered medications for this visit.   Allergies: Allergies  Allergen Reactions   Emetine    Erythromycin    Metoclopramide Other (See Comments)    Reglan   Other Other (See Comments)   Chlordiazepoxide Hcl Rash   Librium [Chlordiazepoxide] Rash   I reviewed her past medical history, social history, family  history, and environmental history and no significant changes have been reported from her previous visit.  ROS: All others negative except as noted per HPI.   Objective: There were no vitals taken for this visit. There is no height or weight on file to calculate BMI. General Appearance:  Alert, cooperative, no distress, appears stated age  Head:  Normocephalic, without obvious abnormality, atraumatic  Eyes:  Conjunctiva clear, EOM's intact  Nose: Nares normal, {Blank multiple:19196:a:"***","hypertrophic turbinates","normal mucosa","no visible anterior polyps","septum midline"}  Throat: Lips, tongue normal; teeth and gums normal, {Blank multiple:19196:a:"***","normal posterior oropharynx","tonsils 2+","tonsils 3+","no tonsillar exudate","+ cobblestoning"}  Neck: Supple, symmetrical  Lungs:   {Blank multiple:19196:a:"***","clear to auscultation bilaterally","end-expiratory wheezing","wheezing throughout"}, Respirations unlabored, {Blank multiple:19196:a:"***","no coughing","intermittent dry coughing"}  Heart:  {Blank multiple:19196:a:"***","regular rate and rhythm","no murmur"}, Appears well perfused  Extremities: No edema  Skin: Skin color, texture, turgor normal, no rashes or lesions on visualized portions of skin  Neurologic: No gross deficits   Previous notes and tests were reviewed. The plan was reviewed with the patient/family, and all questions/concerned were addressed.  It was my pleasure to see Madison Ball today and participate in her care. Please feel free to contact me with any questions or concerns.  Sincerely,  Roney Marion, MD  Allergy & Immunology  [facility]

## 2021-04-02 ENCOUNTER — Other Ambulatory Visit: Payer: Self-pay | Admitting: Allergy

## 2021-04-24 ENCOUNTER — Other Ambulatory Visit: Payer: Self-pay | Admitting: Internal Medicine

## 2021-05-29 NOTE — Progress Notes (Signed)
? ?FOLLOW UP ?Date of Service/Encounter:  06/01/21 ? ? ?Subjective:  ?Madison Ball (DOB: [birth year]) is a 71 y.o. female who returns to the [facility] on 06/01/2021 in re-evaluation of the following: asthma, allergic rhinitis and reflux ?History obtained from: chart review and patient. ? ?For Review, LV was on 01/26/21  with Dr. Edison Pace seen for regular follow-up.  FEV1 89%, normal pattern.  At that visit, complaining of chronic cough which was thought more likely neurogenic/upper airway in nature based on history and exam.  Trial of gabapentin started. ? ?Pertinent History/Diagnostics:  ?Previous allergy testing: yes : grass, weed, dust mite, cat, cockroach ?Interested in Allergy Immunotherapy: no ? ?Today presents for folllow-up. ?Since her last visit, she continues to have an ongoing cough that comes and goes.  It hasn't affected her ability to talk as it had in the past.  If she is having to talk a lot to patients and parents, that tends to bring on the coughing.   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 ?Gabapentin was tried, but she had to stop because she was having floaters.  This started shortly after starting this medication and she felt these were related. ?She does have frequent throat clearing. Gets a tickle on the left side of her throat or top of chest which brings on the cough. ? ?Uses daily nasal wash, montelukast and Dulera every day. ?Prilosec and pepcid she is taking every day.  Has a history of hiatal hernia.  Does find these are helpful.  Denies persistent heartburn.  Has never followed for GI for the symptoms as they have been controlled on her current regimen which we have been managing ?As long as she uses her daily nasal wash, this does seem to help with her drainage.  She does not feel excessive drainage when staying on top of her medications. ?Feels that her asthma has been controlled and has not needed her rescue inhaler at  all since last visit. ? ? ?Allergies as of 06/01/2021   ? ?   Reactions  ? Emetine   ? Erythromycin   ? Metoclopramide Other (See Comments)  ? Reglan  ? Other Other (See Comments)  ? Chlordiazepoxide Hcl Rash  ? Librium [chlordiazepoxide] Rash  ? ?  ? ?  ?Medication List  ?  ? ?  ? Accurate as of June 01, 2021 12:27 PM. If you have any questions, ask your nurse or doctor.  ?  ?  ? ?  ? ?STOP taking these medications   ? ?gabapentin 100 MG capsule ?Commonly known as: Neurontin ?Stopped by: Sigurd Sos, MD ?  ? ?  ? ?TAKE these medications   ? ?albuterol 108 (90 Base) MCG/ACT inhaler ?Commonly known as: ProAir HFA ?Inhale 2 puffs into the lungs every 4 (four) hours as needed for wheezing or shortness of breath. ?  ?azelastine 0.1 % nasal spray ?Commonly known as: ASTELIN ?Place 1-2 sprays into both nostrils 2 (two) times daily as needed for rhinitis. ?  ?diclofenac Sodium 1 % Gel ?Commonly known as: VOLTAREN ?SMARTSIG:2 Inch(es) Topical 4 Times Daily ?  ?Dulera 200-5 MCG/ACT Aero ?Generic drug: mometasone-formoterol ?2 puffs twice daily to prevent coughing or wheezing. ?  ?famotidine 20 MG tablet ?Commonly known as: Pepcid ?Take 1 tablet (20 mg total) by mouth 2 (two) times daily. ?What changed: when to take this ?  ?fluticasone 50 MCG/ACT nasal spray ?Commonly known as: FLONASE ?2 SPRAYS PER  NOSTRIL ONCE DAILY AS NEEDED FOR STUFFY NOSE ?  ?ipratropium 0.06 % nasal spray ?Commonly known as: ATROVENT ?PLACE 2 SPRAYS INTO BOTH NOSTRILS 4 (FOUR) TIMES DAILY AS NEEDED (NASAL DRAINAGE/CONGESTION). ?  ?Jardiance 10 MG Tabs tablet ?Generic drug: empagliflozin ?10 mg daily. ?  ?montelukast 10 MG tablet ?Commonly known as: SINGULAIR ?TAKE 1 TABLET ONCE A DAY AT NIGHT FOR COUGHING OR WHEEZING. ?  ?omeprazole 20 MG capsule ?Commonly known as: PriLOSEC ?Take 1 capsule (20 mg total) by mouth daily. ?  ?potassium chloride 10 MEQ tablet ?Commonly known as: KLOR-CON ?Take 10 mEq by mouth 2 (two) times daily. ?  ?valACYclovir 500 MG  tablet ?Commonly known as: VALTREX ?Take 500 mg by mouth 2 (two) times daily. ?  ?Vitamin E 400 units Tabs ?Take 2 tablets by mouth daily. ?  ?Vytorin 10-40 MG tablet ?Generic drug: ezetimibe-simvastatin ?Take 1 tablet by mouth Daily. ?  ? ?  ? ?Past Medical History:  ?Diagnosis Date  ? Asthma   ? Blood clot in vein   ? 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 Eczema   ? Hypercholesterolemia   ? Recurrent upper respiratory infection (URI)   ? ?Past Surgical History:  ?Procedure Laterality Date  ? ANKLE SURGERY Right   ? TOTAL ABDOMINAL HYSTERECTOMY  1990  ? ?Otherwise, there have been no changes to her past medical history, surgical history, family history, or social history. ? ?ROS: All others negative except as noted per HPI.  ? ?Objective:  ?BP 124/76   Pulse 79   Temp 98.6 ?F (37 ?C)   Resp 16   Ht 5' 1"  (1.549 m)   Wt 135 lb 6.4 oz (61.4 kg)   SpO2 98%   BMI 25.58 kg/m?  ?Body mass index is 25.58 kg/m?Marland Kitchen ?Physical Exam: ?General Appearance:  Alert, cooperative, no distress, appears stated age, recurrent throat clearing throughout exam  ?Head:  Normocephalic, without obvious abnormality, atraumatic  ?Eyes:  Conjunctiva clear, EOM's intact  ?Nose: Nares normal, hypertrophic turbinates, normal mucosa, and no visible anterior polyps  ?Throat: Lips, tongue normal; teeth and gums normal, normal posterior oropharynx and + cobblestoning  ?Neck: Supple, symmetrical  ?Lungs:   clear to auscultation bilaterally, Respirations unlabored, intermittent dry coughing  ?Heart:  regular rate and rhythm and no murmur, Appears well perfused  ?Extremities: No edema  ?Skin: Skin color, texture, turgor normal, no rashes or lesions on visualized portions of skin  ?Neurologic: No gross deficits  ?Spirometry:  ?Tracings reviewed. Her effort: Good reproducible efforts. ?FVC: 1.69L ?FEV1: 1.46L, 88% predicted ?FEV1/FVC ratio: 109% ?Interpretation: Spirometry consistent with normal pattern.  ?Please see scanned spirometry results for  details. ? ?Assessment/Plan  ?Mrs. Meritt continues to have issues with chronic cough and was noted to have chronic throat clearing on exam today.  Based on her history and spirometry today it appears that her asthma is well controlled, and this cough is more of an upper airway issue with likely neurogenic component.  Unfortunately, she did began having floaters after starting gabapentin which she feels is related so we will not restart this medication.  Did discuss possibility of amitriptyline, but she prefers to avoid due to possible adverse side effects.  We discussed the importance of controlling any persistent drainage as well as reflux which can contribute to ongoing upper airway inflammation.  We also discussed behavioral techniques that can prevent chronic throat clearing which is an additional irritant for the throat.  A handout was provided on this.   ?Given that she  has the tools to control both her reflux and drainage, she will consistently use these as well as try the behavioral techniques provided below to see if this improves her ongoing cough.   ?We did discuss getting a chest x-ray given the chronicity of her cough, and as a reassurance for the patient.  Could consider ENT referral in the future if no improvement with the discussed therapies today. ? ?Moderate persistent asthma without complication ?- I think you asthma is well controlled and we don't need to step up asthma therapy but target the cough hypersensitivity syndrome instead  ? ?Chronic throat clearing hand-out as below.  ?Stay hydrated!  ?CXR at your convenience ? ?- Daily controller medication(s):  Dulera 236mg 2 puffs twice daily, Montelukast 1102mdaily  ?- Prior to physical activity: albuterol 2 puffs 10-15 minutes before physical activity. ?- Rescue medications: albuterol 4 puffs every 4-6 hours as needed ?- Changes during respiratory infections or worsening symptoms: Increase Dulera 200 to 4 puffs twice daily for TWO WEEKS. ?- Get  Influenza Vaccine and appropriate Pneumonia and COVID 19 boosters  ?- Asthma control goals:  ?* Full participation in all desired activities (may need albuterol before activity) ?* Albuterol use two time or less a week on

## 2021-06-01 ENCOUNTER — Ambulatory Visit (INDEPENDENT_AMBULATORY_CARE_PROVIDER_SITE_OTHER): Payer: Medicare Other | Admitting: Internal Medicine

## 2021-06-01 ENCOUNTER — Encounter: Payer: Self-pay | Admitting: Internal Medicine

## 2021-06-01 VITALS — BP 124/76 | HR 79 | Temp 98.6°F | Resp 16 | Ht 61.0 in | Wt 135.4 lb

## 2021-06-01 DIAGNOSIS — K219 Gastro-esophageal reflux disease without esophagitis: Secondary | ICD-10-CM | POA: Diagnosis not present

## 2021-06-01 DIAGNOSIS — R053 Chronic cough: Secondary | ICD-10-CM | POA: Diagnosis not present

## 2021-06-01 DIAGNOSIS — J3089 Other allergic rhinitis: Secondary | ICD-10-CM

## 2021-06-01 DIAGNOSIS — J4540 Moderate persistent asthma, uncomplicated: Secondary | ICD-10-CM | POA: Diagnosis not present

## 2021-06-01 DIAGNOSIS — R058 Other specified cough: Secondary | ICD-10-CM

## 2021-06-01 MED ORDER — DULERA 200-5 MCG/ACT IN AERO: INHALATION_SPRAY | RESPIRATORY_TRACT | 1 refills | Status: DC

## 2021-06-01 NOTE — Patient Instructions (Addendum)
Moderate persistent asthma without complication ?- I think you asthma is well controlled and we don't need to step up asthma therapy but target the cough hypersensitivity syndrome instead  ? ?Chronic throat clearing hand-out as below.  ?Stay hydrated!  ?CXR at your convenience ? ?- Daily controller medication(s):  Dulera 277mg 2 puffs twice daily, Montelukast 129mdaily  ?- Prior to physical activity: albuterol 2 puffs 10-15 minutes before physical activity. ?- Rescue medications: albuterol 4 puffs every 4-6 hours as needed ?- Changes during respiratory infections or worsening symptoms: Increase Dulera 200 to 4 puffs twice daily for TWO WEEKS. ?- Get Influenza Vaccine and appropriate Pneumonia and COVID 19 boosters  ?- Asthma control goals:  ?* Full participation in all desired activities (may need albuterol before activity) ?* Albuterol use two time or less a week on average (not counting use with activity) ?* Cough interfering with sleep two time or less a month ?* Oral steroids no more than once a year ?* No hospitalizations ? ? ? Allergic Rhinitis  ?- Continue Avoidance measures   ?- Astelin 1 spray per nostril twice a day, Can add on Flonase as needed for stuffy nose and Atrovent as needed for runny nose  ?- Continue Montelukast 1028maily  ?- Continue Allegra and zyrtec as needed  ?- You can use an extra dose of the antihistamine, if needed, for breakthrough symptoms.  ?- Consider nasal saline rinses 1-2 times daily to remove allergens from the nasal cavities as well as help with mucous clearance (this is especially helpful to do before the nasal sprays are given) ?- Consider allergy shots as a means of long-term control and can reduce lifetime use of medications  ?- Allergy shots "re-train" and "reset" the immune system to ignore environmental allergens and decrease the resulting immune response to those allergens (sneezing, itchy watery eyes, runny nose, nasal congestion, etc).    ?- Allergy shots improve  symptoms in 75-85%  ?- Allergy shots are the only potential permanent and disease modifying option  ?- We can discuss more at the next appointment if the medications are not working for you. ? ?Reflux  ?- Continue your current regimen with Omeprazole/Prilosec and Famotidine  ?- Continue dietary and lifestyle modifications as previously discussed ? ? FOLLOW UP: 3 months, sooner if needed ? ?Thank you so much for letting me partake in your care today.  Don't hesitate to reach out if you have any additional concerns! ? ?EriSigurd SosD ?[facility] ? ?CHRONIC THROAT CLEARING-WHAT TO DO!! ?Causes of chronic throat clearing may include the following (among others):  ?Acid reflux (lanyngopharyngeal reflux) ?Allergies (pollens, pet dander, dust mites, etc) ?Non allergic rhinitis (runny nose, post nasal drainage or congestion NOT caused by allergies-can be secondary to pollutants, cold air, changes in blood vessels to the nose as we age,etc) ?Environmental irritants (tobacco, smoke, air pollution) ?Asthma ? ?If present for a long period of time, throat clearing can become a habit. ?We will work with you to treat any of the medical reasons for throat clearing.  Your job is to help prevent the habit, which can cause damage (redness and swelling) to your vocal cords.  It will require a conscious effort on your behalf. ? ?Tips for prevention of throat clearing:  ?Instead of clearing your throat, swallow instead.   ?Carrying around water (or something to drink) will help you move the mucus in the right direction.  IF you have the urge to clear your  throat, drink your water. ?If you absolutely have to clear your throat, use a non-traumatic exercise to do so.   ?Pant with your mouth open saying ?[place], [place], [place]? with a powerful, breathy voice. ?Increase water intake.  This thins secretions, making them easier to swallow. ?Chew baking soda gum ([employer]) which can help with swallowing, reflux, and throat  clearing.  Try to chew up to three times daily.   If you experience jaw pain or headaches, decrease the amount of chewing. ?Suck on sugar free hard candy to help with swallowing. ?Have your friends and family remind you to swallow when they hear you throat clearing.  As this can be habit forming, sometimes you may not realize you are doing this.  Having someone point it out to you, will help you become more conscious of the behavior. ?BE PATIENT.  This will take time to resolve, and some do not see improvement until 8-12 weeks into therapy/behavior modifications. ? ? ? ?

## 2021-06-08 ENCOUNTER — Ambulatory Visit (HOSPITAL_BASED_OUTPATIENT_CLINIC_OR_DEPARTMENT_OTHER)
Admission: RE | Admit: 2021-06-08 | Discharge: 2021-06-08 | Disposition: A | Discharge status: Home / Self Care | Payer: Medicare Other | Source: Ambulatory Visit | Attending: Internal Medicine | Admitting: Internal Medicine

## 2021-06-08 DIAGNOSIS — R053 Chronic cough: Secondary | ICD-10-CM | POA: Diagnosis not present

## 2021-06-08 NOTE — Progress Notes (Signed)
Please let Madison Ball know that her chest x-ray is really reassuring. No abnormalities were reported.

## 2021-12-29 ENCOUNTER — Encounter: Payer: Self-pay | Admitting: Family Medicine

## 2021-12-30 ENCOUNTER — Other Ambulatory Visit: Payer: Self-pay | Admitting: Family Medicine

## 2021-12-30 DIAGNOSIS — S199XXA Unspecified injury of neck, initial encounter: Secondary | ICD-10-CM

## 2022-01-23 ENCOUNTER — Other Ambulatory Visit: Payer: Medicare Other

## 2022-01-25 ENCOUNTER — Ambulatory Visit
Admission: RE | Admit: 2022-01-25 | Discharge: 2022-01-25 | Disposition: A | Discharge status: Home / Self Care | Payer: Medicare Other | Source: Ambulatory Visit | Attending: Family Medicine | Admitting: Family Medicine

## 2022-01-25 DIAGNOSIS — S199XXA Unspecified injury of neck, initial encounter: Secondary | ICD-10-CM

## 2022-08-04 ENCOUNTER — Other Ambulatory Visit: Payer: Self-pay | Admitting: Internal Medicine

## 2022-09-07 ENCOUNTER — Other Ambulatory Visit: Payer: Self-pay | Admitting: Internal Medicine

## 2022-10-28 ENCOUNTER — Other Ambulatory Visit: Payer: Self-pay | Admitting: Internal Medicine

## 2023-01-03 ENCOUNTER — Encounter: Payer: Self-pay | Admitting: Internal Medicine

## 2023-01-03 ENCOUNTER — Ambulatory Visit (INDEPENDENT_AMBULATORY_CARE_PROVIDER_SITE_OTHER): Payer: Medicare Other | Admitting: Internal Medicine

## 2023-01-03 VITALS — BP 120/74 | HR 76 | Temp 97.7°F | Resp 16 | Wt 139.5 lb

## 2023-01-03 DIAGNOSIS — J310 Chronic rhinitis: Secondary | ICD-10-CM | POA: Diagnosis not present

## 2023-01-03 DIAGNOSIS — K219 Gastro-esophageal reflux disease without esophagitis: Secondary | ICD-10-CM

## 2023-01-03 DIAGNOSIS — J4540 Moderate persistent asthma, uncomplicated: Secondary | ICD-10-CM | POA: Diagnosis not present

## 2023-01-03 DIAGNOSIS — R058 Other specified cough: Secondary | ICD-10-CM

## 2023-01-03 DIAGNOSIS — H6121 Impacted cerumen, right ear: Secondary | ICD-10-CM

## 2023-01-03 MED ORDER — IPRATROPIUM BROMIDE 0.06 % NA SOLN: NASAL | 3 refills | Status: DC

## 2023-01-03 MED ORDER — DULERA 200-5 MCG/ACT IN AERO: INHALATION_SPRAY | RESPIRATORY_TRACT | 12 refills | Status: AC

## 2023-01-03 NOTE — Addendum Note (Signed)
Addended by: Berna Bue on: 01/03/2023 04:21 PM   Modules accepted: Orders

## 2023-01-03 NOTE — Patient Instructions (Addendum)
Asthma Well controlled with Dulera 2 puffs once daily and Albuterol as needed. No recent exacerbations requiring steroids or antibiotics. -Continue Dulera 200 mcg 2 puffs once daily. -Increase Dulera 200 mcg to 2 puffs twice daily for 1-2 weeks during asthma flare. -Continue Albuterol 2 puffs every 4 hours as needed for wheezing or shortness of breath.  Upper Airway Cough Syndrome Symptoms of throat tickle and drainage. Currently managed with Zyrtec as needed and Ipratropium nasal spray. -Continue Zyrtec 10 mg daily as needed. -Continue Ipratropium nasal spray 1-2 sprays up to three times daily as needed.  Gastroesophageal Reflux Disease Managed with Omeprazole 40mg  twice daily and Famotidine at night. -Continue Omeprazole 40mg  twice daily. -Continue Famotidine 20 mg at night.  Impacted Cerumen Noted in right ear. -Use Debrox to loosen and remove wax.  Follow-up in 1 year unless issues arise.

## 2023-01-03 NOTE — Progress Notes (Signed)
FOLLOW UP Date of Service/Encounter:  01/03/23  Subjective:  Madison Ball (DOB: 19-Nov-1950) is a 72 y.o. female [facility] in re-evaluation of the following: asthma, upper airway cough, allergic rhinitis, reflux History obtained from: chart review and patient.  For Review, LV was on 06/01/21  with Dr.Yareth Kearse seen for routine follow-up. See below for summary of history and diagnostics.   Therapeutic plans/changes recommended: still reported coughing ----------------------------------------------------- Pertinent History/Diagnostics:  Previous allergy testing: yes : grass, weed, dust mite, cat, cockroach Interested in Allergy Immunotherapy: no Chronic cough/suspected upper airway cough-failed gabapentin Current meds: Dulera, montelukast and nasal wash, prilosec and pepcid FEV1 88% 05/2021-We did recommend a CXR given chronicity of cough as well as consideration of ENT referral CXR 06/2021: read as normal --------------------------------------------------- Today presents for follow-up. Discussed the use of AI scribe software for clinical note transcription with the patient, who gave verbal consent to proceed.  History of Present Illness   The patient, with a history of asthma and acid reflux, reports no recent episodes of difficulty speaking. She continues to experience occasional throat tickling and drainage. To manage these symptoms, she is on Prilosec and Pepcid for acid reflux, ipratropium for drainag control and Dulera for asthma control. The patient reports that this combination of medications seems to control her symptoms well. She uses Dulera two puffs once a day and montelukast, with Zyrtec as needed.   In the past year, she has required antibiotics twice, likely for bronchitis exacerbations. She has not needed steroids or albuterol rescue inhaler within the last year.      All medications reviewed by clinical staff and updated  in chart. No new pertinent medical or surgical history except as noted in HPI.  ROS: All others negative except as noted per HPI.   Objective:  BP 120/74 (BP Location: Right Arm, Patient Position: Sitting, Cuff Size: Normal)   Pulse 76   Temp 97.7 F (36.5 C) (Temporal)   Resp 16   Wt 139 lb 8 oz (63.3 kg)   SpO2 99%   BMI 26.36 kg/m  Body mass index is 26.36 kg/m. Physical Exam: General Appearance:  Alert, cooperative, no distress, appears stated age  Head:  Normocephalic, without obvious abnormality, atraumatic  Eyes:  Conjunctiva clear, EOM's intact  Ears Right TM with impacter cerumen. Left TM normal and EACs normal bilaterally  Nose: Nares normal, hypertrophic turbinates, normal mucosa, and no visible anterior polyps  Throat: Lips, tongue normal; teeth and gums normal, normal posterior oropharynx  Neck: Supple, symmetrical  Lungs:   clear to auscultation bilaterally, Respirations unlabored, no coughing  Heart:  regular rate and rhythm and no murmur, Appears well perfused  Extremities: No edema  Skin: Skin color, texture, turgor normal and no rashes or lesions on visualized portions of skin  Neurologic: No gross deficits   Labs:  Lab Orders  No laboratory test(s) ordered today    Spirometry:  Tracings reviewed. Her effort: Good reproducible efforts. FVC: 1.56L  FEV1: 1.42L, 88% predicted FEV1/FVC ratio: 0.91 Interpretation: Spirometry consistent with normal pattern.  Please see scanned spirometry results for details.   Assessment/Plan   Assessment and Plan    Asthma Well controlled with Dulera 2 puffs once daily and Albuterol as needed. No recent exacerbations requiring steroids or antibiotics. -Continue Dulera 200 mcg 2 puffs once daily. -Increase Dulera 200 mcg to 2 puffs twice daily for 1-2 weeks during asthma flare. -Continue Albuterol 2 puffs every 4 hours  as needed for wheezing or shortness of breath.  Upper Airway Cough Syndrome Symptoms of throat  tickle and drainage. Currently managed with Zyrtec as needed and Ipratropium nasal spray. -Continue Zyrtec 10 mg daily as needed. -Continue Ipratropium nasal spray 1-2 sprays up to three times daily as needed.  Gastroesophageal Reflux Disease Managed with Omeprazole 40mg  twice daily and Famotidine at night. -Continue Omeprazole 40mg  twice daily. -Continue Famotidine 20 mg at night.  Impacted Cerumen Noted in right ear. -Use Debrox to loosen and remove wax.  Follow-up in 1 year unless issues arise.     Other: none  Tonny Bollman, MD  [facility]

## 2023-01-20 ENCOUNTER — Other Ambulatory Visit: Payer: Self-pay

## 2023-01-20 ENCOUNTER — Emergency Department (HOSPITAL_BASED_OUTPATIENT_CLINIC_OR_DEPARTMENT_OTHER)
Admission: EM | Admit: 2023-01-20 | Discharge: 2023-01-20 | Disposition: A | Discharge status: Home / Self Care | Payer: Medicare Other | Attending: Emergency Medicine | Admitting: Emergency Medicine

## 2023-01-20 ENCOUNTER — Emergency Department (HOSPITAL_BASED_OUTPATIENT_CLINIC_OR_DEPARTMENT_OTHER): Payer: Medicare Other

## 2023-01-20 ENCOUNTER — Encounter (HOSPITAL_BASED_OUTPATIENT_CLINIC_OR_DEPARTMENT_OTHER): Payer: Self-pay | Admitting: Urology

## 2023-01-20 DIAGNOSIS — S4991XA Unspecified injury of right shoulder and upper arm, initial encounter: Secondary | ICD-10-CM | POA: Diagnosis present

## 2023-01-20 DIAGNOSIS — W1839XA Other fall on same level, initial encounter: Secondary | ICD-10-CM | POA: Diagnosis not present

## 2023-01-20 DIAGNOSIS — S40011A Contusion of right shoulder, initial encounter: Secondary | ICD-10-CM | POA: Diagnosis not present

## 2023-01-20 DIAGNOSIS — W19XXXA Unspecified fall, initial encounter: Secondary | ICD-10-CM

## 2023-01-20 NOTE — ED Triage Notes (Signed)
Pt states had mechanical fall yesterday onto cement floor  C/o right knee, right elbow, and right shoulder pain  Pt states significant bruising  Denies Head injury or LOC   NO blood thinners

## 2023-01-20 NOTE — ED Notes (Signed)
Pt. Reports she had a fall yesterday injuring her R shoulder and her R knee with bruising noted.  Pt. In no distress.  Pt. Has ROM in the R arm and shoulde and is able to walk.

## 2023-01-20 NOTE — ED Provider Notes (Signed)
[facility] Provider Note   CSN: [id number] Arrival date & time: 01/20/23  1804     History  Chief Complaint  Patient presents with   Madison Ball is a 72 y.o. female who had a ground-level fall yesterday afternoon.  She states that she caught her feet tangled up, fell to the floor and was unable to brace herself.  She primarily fell on her right shoulder and elbow as well as her right knee. Denies head trauma or loss of consciousness.  She woke up this a.m. with increasing tenderness and soreness prompting her ED visit for further evaluation.   Fall Pertinent negatives include no chest pain, no abdominal pain and no shortness of breath.       Home Medications Prior to Admission medications   Medication Sig Start Date End Date Taking? Authorizing Provider  albuterol (PROAIR HFA) 108 (90 Base) MCG/ACT inhaler Inhale 2 puffs into the lungs every 4 (four) hours as needed for wheezing or shortness of breath. 11/03/20   Marcelyn Bruins, MD  azelastine (ASTELIN) 0.1 % nasal spray Place 1-2 sprays into both nostrils 2 (two) times daily as needed for rhinitis. 11/13/18   Bobbitt, Heywood Iles, MD  diclofenac Sodium (VOLTAREN) 1 % GEL SMARTSIG:2 Inch(es) Topical 4 Times Daily 07/04/19   [provider]  famotidine (PEPCID) 20 MG tablet Take 1 tablet (20 mg total) by mouth 2 (two) times daily. Patient not taking: Reported on 01/03/2023 02/20/18   Bobbitt, Heywood Iles, MD  fluticasone [facility]) 50 MCG/ACT nasal spray 2 SPRAYS PER NOSTRIL ONCE DAILY AS NEEDED FOR STUFFY NOSE Patient not taking: Reported on 06/01/2021 01/14/20   Hetty Blend, FNP  ipratropium (ATROVENT) 0.06 % nasal spray 1-2 sprays in each nostril up to three times daily as needed 01/03/23   Verlee Monte, MD  JARDIANCE 10 MG TABS tablet 10 mg daily.  01/10/18   [provider]  mometasone-formoterol (DULERA) 200-5 MCG/ACT AERO 2 puffs twice  daily to prevent coughing or wheezing. 01/03/23   Verlee Monte, MD  montelukast (SINGULAIR) 10 MG tablet TAKE 1 TABLET ONCE A DAY AT NIGHT FOR COUGHING OR WHEEZING. 04/02/21   Verlee Monte, MD  omeprazole (PRILOSEC) 20 MG capsule Take 1 capsule (20 mg total) by mouth daily. 02/20/18   Bobbitt, Heywood Iles, MD  potassium chloride (KLOR-CON) 10 MEQ tablet Take 10 mEq by mouth 2 (two) times daily. 05/29/19   [provider]  valACYclovir (VALTREX) 500 MG tablet Take 500 mg by mouth 2 (two) times daily. Patient not taking: Reported on 06/01/2021 04/23/19   [provider]  Vitamin E 400 UNITS TABS Take 2 tablets by mouth daily.    [provider]  VYTORIN 10-40 MG per tablet Take 1 tablet by mouth Daily. 10/13/10   [provider]      Allergies    Emetine, Erythromycin, Metoclopramide, Other, Chlordiazepoxide hcl, and Librium [chlordiazepoxide]    Review of Systems   Review of Systems  Constitutional:  Negative for chills and fever.  Respiratory:  Negative for shortness of breath.   Cardiovascular:  Negative for chest pain.  Gastrointestinal:  Negative for abdominal pain, blood in stool, constipation, diarrhea, nausea and vomiting.  Genitourinary:  Negative for dysuria and hematuria.  Musculoskeletal:  Positive for arthralgias and myalgias.  Skin:  Positive for color change.    Physical Exam Updated Vital Signs BP (!) 170/81 (BP Location: Left  Arm)   Pulse 88   Temp 98 F (36.7 C) (Oral)   Resp 16   Ht 5\' 1"  (1.549 m)   Wt 63.2 kg   SpO2 98%   BMI 26.33 kg/m  Physical Exam Constitutional:      Appearance: Normal appearance.  Cardiovascular:     Rate and Rhythm: Normal rate and regular rhythm.  Pulmonary:     Effort: Pulmonary effort is normal.     Breath sounds: Normal breath sounds.  Abdominal:     General: Abdomen is flat.     Palpations: Abdomen is soft.  Musculoskeletal:     Comments: Tenderness to palpation of the right  shoulder-predominantly around the upper trap and posterior shoulder area.  There is a small excoriation/laceration of the posterior right shoulder as well as some bruising of the posterior right shoulder.  Internal rotation of the right shoulder is slightly limited.  Otherwise maintained flexion and abduction of the right shoulder.  Some pain with resisted abduction of the right shoulder in the area of the supraspinatus muscle.  Tenderness to palpation of the elbow most predominant at the proximal insertion site of the extensor tendons.  Mild tenderness to palpation of the right knee, no large effusion appreciated.   Skin:    General: Skin is warm and dry.  Neurological:     Mental Status: She is alert.     ED Results / Procedures / Treatments   Labs (all labs ordered are listed, but only abnormal results are displayed) Labs Reviewed - No data to display  EKG None  Radiology DG Elbow Complete Right Result Date: 01/20/2023 CLINICAL DATA:  Larey Seat, pain EXAM: RIGHT ELBOW - COMPLETE 3+ VIEW COMPARISON:  None Available. FINDINGS: Frontal, bilateral oblique, lateral views of the right elbow are obtained. No acute fracture, subluxation, or dislocation. Joint spaces are well preserved. No joint effusion. Soft tissues are unremarkable. IMPRESSION: 1. Unremarkable right elbow. Electronically Signed   By: Sharlet Salina M.D.   On: 01/20/2023 19:22   DG Knee Complete 4 Views Right Result Date: 01/20/2023 CLINICAL DATA:  Larey Seat, right knee pain EXAM: RIGHT KNEE - COMPLETE 4+ VIEW COMPARISON:  03/09/2014 FINDINGS: Frontal, bilateral oblique, lateral views of the right knee are obtained. No acute fracture, subluxation, or dislocation. Mild 3 compartmental osteoarthritis. Small joint effusion. Mild infrapatellar soft tissue swelling. IMPRESSION: 1. No acute fracture. 2. Mild infrapatellar soft tissue swelling. 3. Mild 3 compartmental osteoarthritis, with likely reactive joint effusion. Electronically  Signed   By: Sharlet Salina M.D.   On: 01/20/2023 19:21   DG Shoulder Right Result Date: 01/20/2023 CLINICAL DATA:  Larey Seat, right shoulder pain EXAM: RIGHT SHOULDER - 2+ VIEW COMPARISON:  None Available. FINDINGS: Frontal, transscapular, and axillary views of the right shoulder are obtained. No acute fracture, subluxation, or dislocation. Moderate acromioclavicular and glenohumeral joint osteoarthritis. Narrowing of the acromial humeral interval compatible with chronic longstanding rotator cuff tear. Soft tissues are unremarkable. Right chest is clear. IMPRESSION: 1. Moderate osteoarthritis. 2. No acute fracture. 3. Narrowed acromial humeral interval suggesting chronic longstanding rotator cuff tear. Electronically Signed   By: Sharlet Salina M.D.   On: 01/20/2023 19:20    Procedures Procedures    Medications Ordered in ED Medications - No data to display  ED Course/ Medical Decision Making/ A&P Clinical Course as of 01/20/23 2046  Thu Jan 20, 2023  1948 Stable 72YOF with mech fall.  [CC]    Clinical Course User Index [CC] Glyn Ade, MD  Medical Decision Making This is an otherwise healthy 72 year old female presenting after a ground-level fall.  She denies loss of consciousness or head trauma.  The majority of her symptoms are related to her right upper extremity, specifically the elbow and shoulder.  X-rays today negative for acute fracture, but did reveal some chronic degenerative changes of the right elbow shoulder and knee. Her range of motion is relatively well-maintained, mostly limited by pain.  Her strength also appears to be symmetric.  I cannot rule out acute strain such as supraspinatus tear or other rotator cuff pathology.  I do not believe that she would benefit from surgical management or orthopedic evaluation at this time.  Recommend that the patient follows up with her PCP for possible PT referral.  Pain management with  over-the-counter medications including acetaminophen and ibuprofen.  Patient otherwise safe and stable for discharge given negative radiographs  Amount and/or Complexity of Data Reviewed Radiology: ordered and independent interpretation performed. Decision-making details documented in ED Course. ECG/medicine tests: ordered and independent interpretation performed. Decision-making details documented in ED Course.           Final Clinical Impression(s) / ED Diagnoses Final diagnoses:  Fall, initial encounter    Rx / DC Orders ED Discharge Orders     None         Lovie Macadamia, MD 01/20/23 0981    Glyn Ade, MD 01/20/23 [phone]

## 2023-01-20 NOTE — Discharge Instructions (Addendum)
He was seen today after a fall.  Your x-rays did not show any acute fractures or broken bones.  You may have some soft tissue and muscle tears which cannot be seen on x-ray.  I would recommend that you follow-up with your primary care doctor who can further evaluate you and potentially for you for physical therapy.  For pain management please use Tylenol and ibuprofen.  I would recommend that you take 1 g of ibuprofen every 6 hours up to 3 times a day.  You may also stagger this with ibuprofen 600 mg every 6 hours up to 3 times a day.  In order to obtain your x-rays from today's visit please call medical records [phone].  Thank you for allowing [place] to be part of your care

## 2023-07-26 IMAGING — DX DG CHEST 2V
2 series · 2 of 2 positions shown · non-contrast
Comparison: February 05, 2019

CLINICAL DATA: Chronic cough.

EXAM:
CHEST - 2 VIEW

[chest pa]
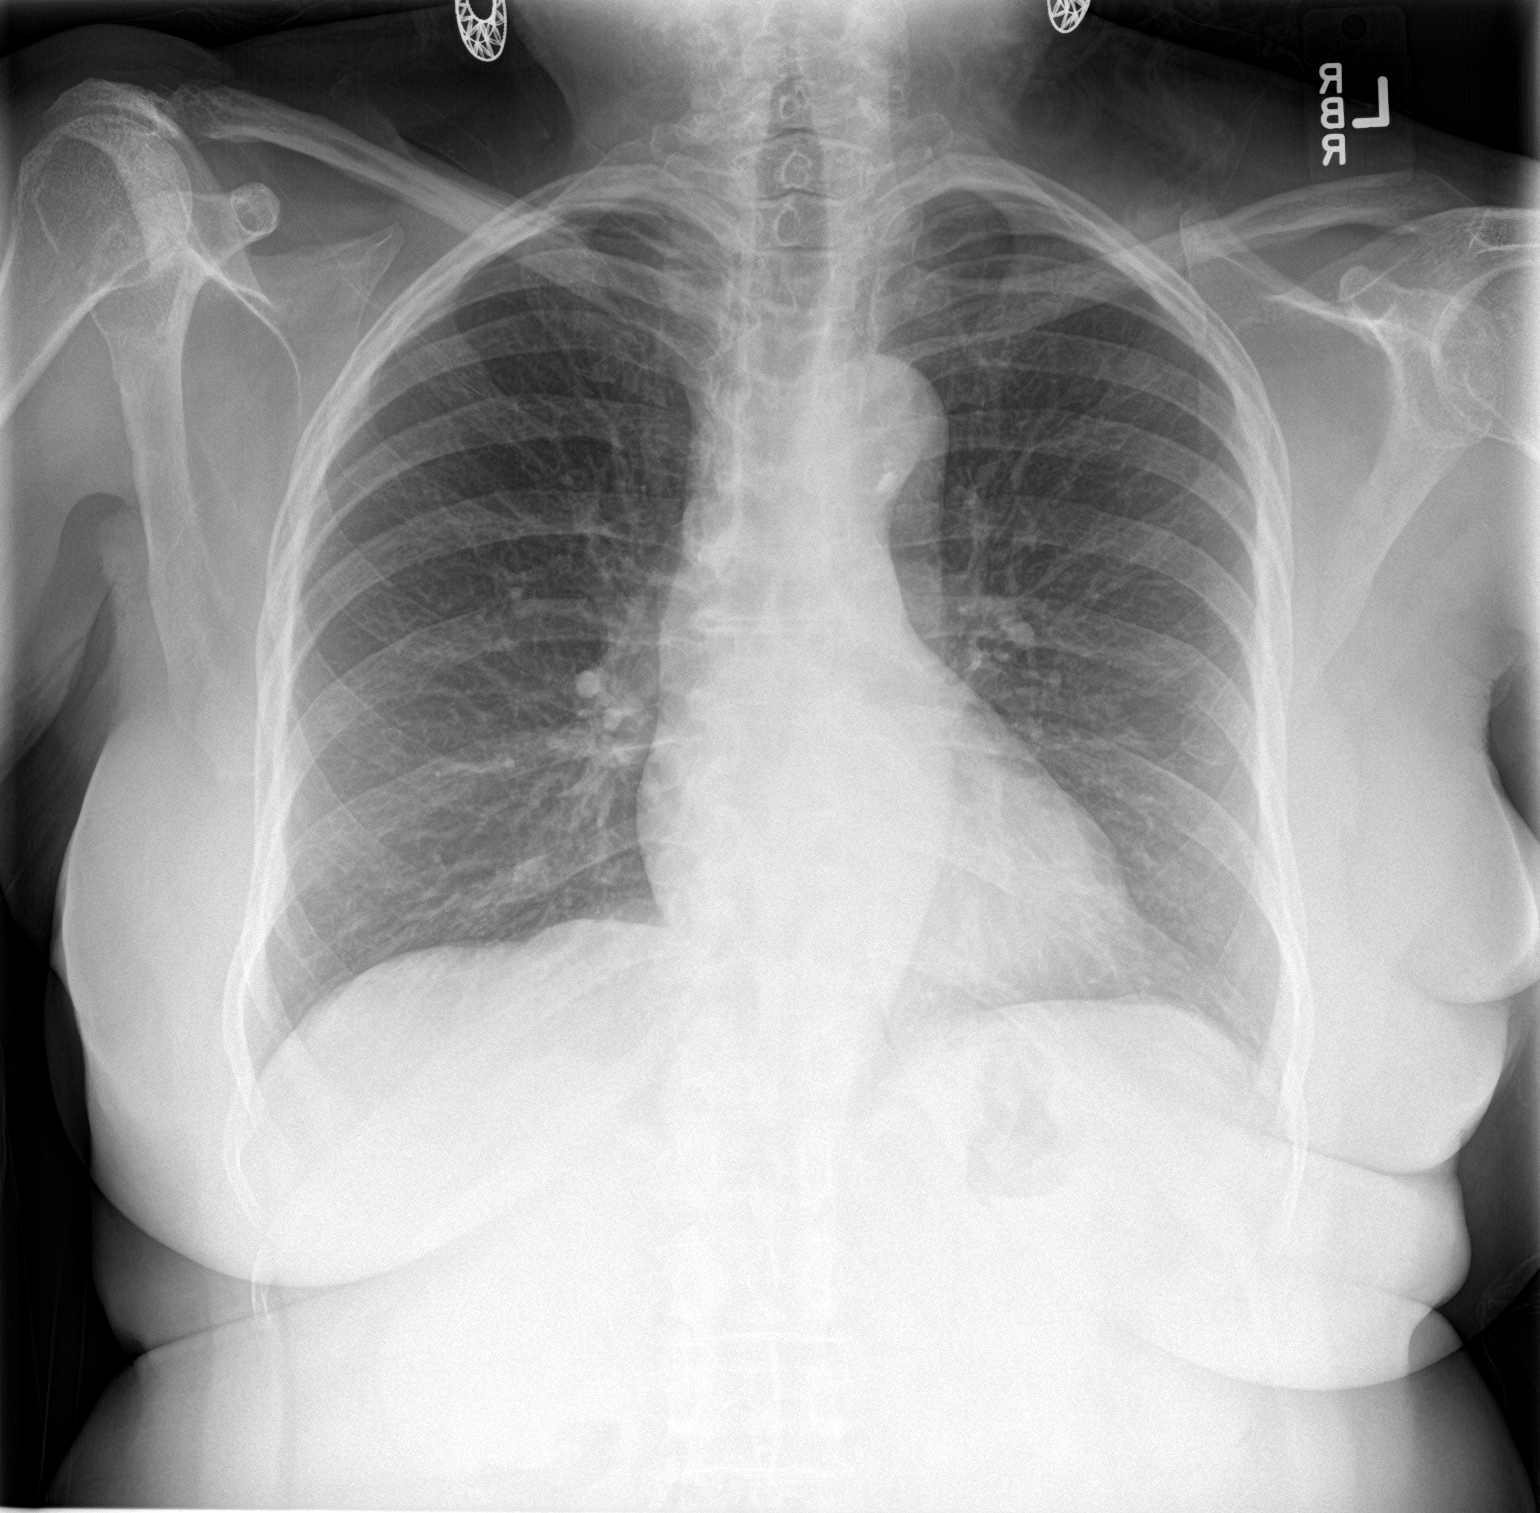

[chest lat]
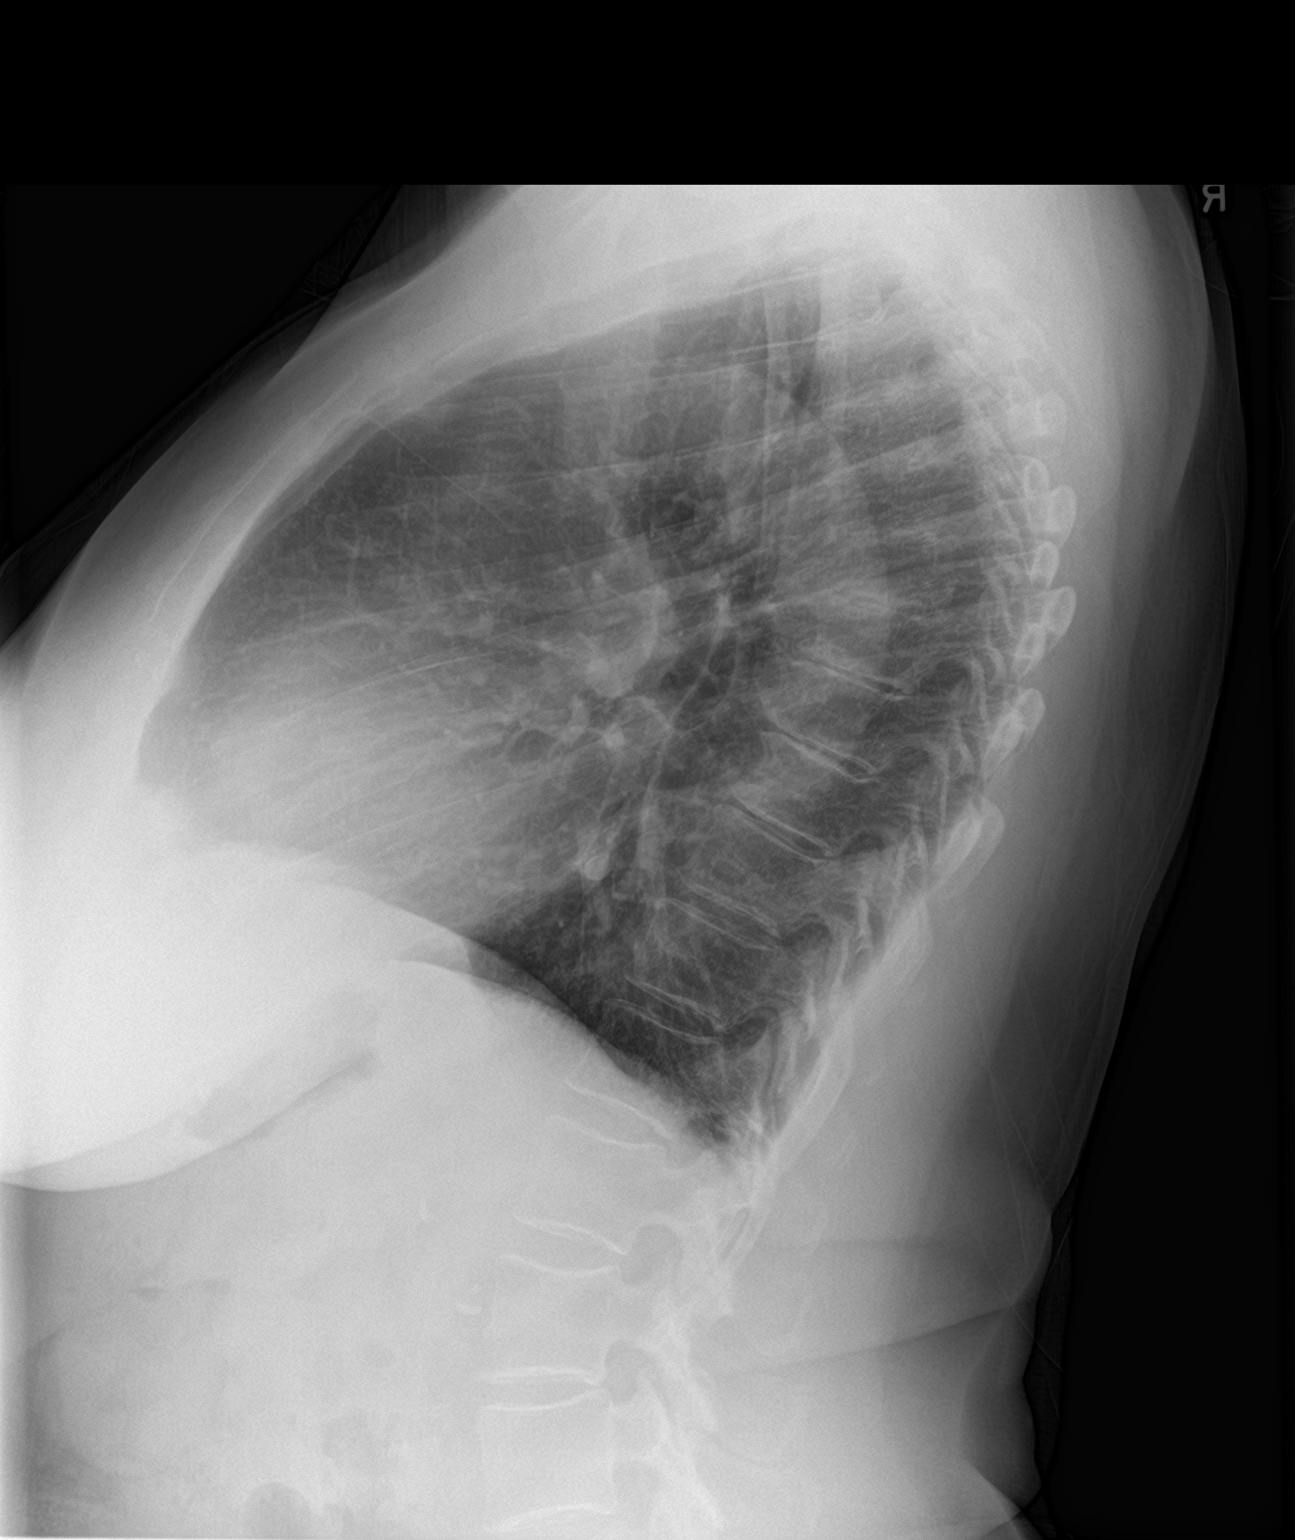

[2 of 2 positions shown; findings below may reference images not displayed]

FINDINGS: The heart size and mediastinal contours are within normal limits.
Both lungs are clear. The visualized skeletal structures are
unremarkable.
IMPRESSION: No active cardiopulmonary disease.

## 2023-09-14 ENCOUNTER — Other Ambulatory Visit: Payer: Self-pay | Admitting: Internal Medicine

## 2023-10-11 ENCOUNTER — Other Ambulatory Visit: Payer: Self-pay | Admitting: Internal Medicine

## 2023-11-07 ENCOUNTER — Encounter: Payer: Self-pay | Admitting: Family Medicine

## 2023-11-07 ENCOUNTER — Ambulatory Visit (INDEPENDENT_AMBULATORY_CARE_PROVIDER_SITE_OTHER): Admitting: Family Medicine

## 2023-11-07 ENCOUNTER — Other Ambulatory Visit: Payer: Self-pay

## 2023-11-07 VITALS — BP 138/84 | HR 79 | Temp 98.0°F | Resp 18 | Ht 61.0 in | Wt 131.9 lb

## 2023-11-07 DIAGNOSIS — J302 Other seasonal allergic rhinitis: Secondary | ICD-10-CM | POA: Diagnosis not present

## 2023-11-07 DIAGNOSIS — R058 Other specified cough: Secondary | ICD-10-CM | POA: Diagnosis not present

## 2023-11-07 DIAGNOSIS — J4530 Mild persistent asthma, uncomplicated: Secondary | ICD-10-CM | POA: Diagnosis not present

## 2023-11-07 DIAGNOSIS — K219 Gastro-esophageal reflux disease without esophagitis: Secondary | ICD-10-CM | POA: Diagnosis not present

## 2023-11-07 MED ORDER — MONTELUKAST SODIUM 10 MG PO TABS: ORAL_TABLET | ORAL | 1 refills | Status: AC

## 2023-11-07 MED ORDER — MONTELUKAST SODIUM 10 MG PO TABS: ORAL_TABLET | ORAL | 0 refills | Status: DC

## 2023-11-07 NOTE — Progress Notes (Signed)
 [address] [place] [address] Dept: [phone]  FOLLOW UP NOTE  Patient ID: Madison Ball, female    DOB: 1950-08-01  Age: 73 y.o. MRN: [id number] Date of Office Visit: 11/07/2023  Assessment  Chief Complaint: Follow-up (Medication refill. )  HPI Madison Ball is a 73 year old female who presents to the clinic for a follow up visit. She was last seen in this clinic on 01/03/2023 by Dr. Marinda for evaluation of asthma, allergic rhinitis, UACS, and reflux.   At today's visit, she reports her asthma has been moderately well controlled with slight cough that began about 2-3 weeks ago. She reports that she ran out of montelukast  about 1 month ago. She denies shortness of breath or wheeze with activity or rest. She continues Dulera  200-2 puffs once a day and has not used albuterol  for the last 2 years. She has not needed to increase her Dulera  dose for asthma flare over the last 2 years.   Allergic rhinitis is reported as moderately well controlled with cough that began about 2-3 weeks ago. Prior to that point her rhinitis had been well controlled. She continues ipratropium several times a day and uses nasal saline rinses daily. Her last environmental allergy testing was positive to pollen, dust mite, cat, and cockroach.   Reflux is reported as well controlled with only infrequent episodes of heartburn for which she continues omeprazole  40 mg once a day and famotidine  20 mg once a day. She rarely uses omeprazole  40 mg twice a day.  Her current medications are listed in the chart.   Chart review: CLINICAL DATA:  Fell, pain  EXAM:  RIGHT ELBOW - COMPLETE 3+ VIEW  COMPARISON:  None Available.   FINDINGS:  Frontal, bilateral oblique, lateral views of the right elbow are  obtained. No acute fracture, subluxation, or dislocation. Joint  spaces are well preserved. No joint effusion. Soft tissues are  unremarkable.   IMPRESSION:  1. Unremarkable right elbow.   Electronically  Signed    By: Ozell Daring M.D.    On: 01/20/2023 19:22   Drug Allergies:  Allergies  Allergen Reactions   Emetine    Erythromycin    Metoclopramide Other (See Comments)    Reglan   Other Other (See Comments)   Chlordiazepoxide Hcl Rash   Librium [Chlordiazepoxide] Rash    Physical Exam: BP 138/84 (BP Location: Right Arm, Patient Position: Sitting, Cuff Size: Normal)   Pulse 79   Temp 98 F (36.7 C) (Temporal)   Resp 18   Ht 5' 1 (1.549 m)   Wt 131 lb 14.4 oz (59.8 kg)   SpO2 96%   BMI 24.92 kg/m    Physical Exam Vitals reviewed.  Constitutional:      Appearance: Normal appearance.  HENT:     Head: Normocephalic and atraumatic.     Right Ear: Tympanic membrane normal.     Left Ear: Tympanic membrane normal.     Nose:     Comments: Bilateral nares slightly erythematous with thin clear nasal drainage noted. Pharynx slightly erythematous with no exudate. Ears normal. Eyes normal.    Mouth/Throat:     Pharynx: Oropharynx is clear.  Eyes:     Conjunctiva/sclera: Conjunctivae normal.  Cardiovascular:     Rate and Rhythm: Normal rate and regular rhythm.     Heart sounds: Normal heart sounds. No murmur heard. Pulmonary:     Effort: Pulmonary effort is normal.     Breath sounds: Normal  breath sounds.     Comments: Lungs clear to auscultation Musculoskeletal:        General: Normal range of motion.     Cervical back: Normal range of motion and neck supple.  Skin:    General: Skin is warm and dry.  Neurological:     Mental Status: She is alert and oriented to person, place, and time.  Psychiatric:        Mood and Affect: Mood normal.        Behavior: Behavior normal.        Thought Content: Thought content normal.        Judgment: Judgment normal.     Diagnostics: FVC 1.91 which is 93% of predicted value. FEV1 1.65 which is 103% of predicted value. Spirometry indicates normal ventilatory function.   Assessment and Plan: 1. Mild persistent asthma without  complication   2. Other seasonal allergic rhinitis   3. Gastroesophageal reflux disease without esophagitis   4. Upper airway cough syndrome     Meds ordered this encounter  Medications   DISCONTD: montelukast  (SINGULAIR ) 10 MG tablet    Sig: Take 1 tablet once a day at night for coughing or wheezing.    Dispense:  90 tablet    Refill:  0   montelukast  (SINGULAIR ) 10 MG tablet    Sig: Take 1 tablet once a day at night for coughing or wheezing.    Dispense:  90 tablet    Refill:  1    Patient Instructions  Asthma Continue Dulera  200-2 puffs once a day with a spacer to prevent cough or wheeze Continue albuterol  2 puffs once every 4 hours if needed for cough or wheeze You may use albuterol  2 puffs 5-15 minutes before activity to decrease cough or wheeze  For asthma flare, increase Dulera  200 to 2 puffs twice a day for 1-2 weeks or until cough and wheeze free  Upper airway cough syndrome Continue montelukast  10 mg once a day Continue cetirizine 10 mg once a day if needed for a runny nose or itch Continue ipratropium 2 sprays in each nostril up to three times a day if needed Continue saline nasal rinses as needed for nasal symptoms. Use this before any medicated nasal sprays for best result  Allergic rhinitis Continue allergen avoidance measures directed toward pollen, dust mites, cat, and cockroach as listed below  Reflux Continue dietary and lifestyle modifications as listed below Continue omeprazole  40 mg once a day. Take this medication 30-60 minutes before a meal for best results Continue famotidine  20 mg once a day for control of reflux  Call the clinic if this treatment plan is not working well for you  Follow up in 6 months or sooner if needed.   Return in about 6 months (around 05/06/2024), or if symptoms worsen or fail to improve.    Thank you for the opportunity to care for this patient.  Please do not hesitate to contact me with questions.  Arlean Mutter,  FNP [facility] 

## 2023-11-07 NOTE — Patient Instructions (Addendum)
 Asthma Continue Dulera  200-2 puffs once a day with a spacer to prevent cough or wheeze Continue albuterol  2 puffs once every 4 hours if needed for cough or wheeze You may use albuterol  2 puffs 5-15 minutes before activity to decrease cough or wheeze  For asthma flare, increase Dulera  200 to 2 puffs twice a day for 1-2 weeks or until cough and wheeze free  Upper airway cough syndrome Continue montelukast  10 mg once a day Continue cetirizine 10 mg once a day if needed for a runny nose or itch Continue ipratropium 2 sprays in each nostril up to three times a day if needed Continue saline nasal rinses as needed for nasal symptoms. Use this before any medicated nasal sprays for best result  Allergic rhinitis Continue allergen avoidance measures directed toward pollen, dust mites, cat, and cockroach as listed below  Reflux Continue dietary and lifestyle modifications as listed below Continue omeprazole  40 mg once a day. Take this medication 30-60 minutes before a meal for best results Continue famotidine  20 mg once a day for control of reflux  Call the clinic if this treatment plan is not working well for you  Follow up in 6 months or sooner if needed.  Reducing Pollen Exposure The American Academy of Allergy, Asthma and Immunology suggests the following steps to reduce your exposure to pollen during allergy seasons. Do not hang sheets or clothing out to dry; pollen may collect on these items. Do not mow lawns or spend time around freshly cut grass; mowing stirs up pollen. Keep windows closed at night.  Keep car windows closed while driving. Minimize morning activities outdoors, a time when pollen counts are usually at their highest. Stay indoors as much as possible when pollen counts or humidity is high and on windy days when pollen tends to remain in the air longer. Use air conditioning when possible.  Many air conditioners have filters that trap the pollen spores. Use a HEPA room air  filter to remove pollen form the indoor air you breathe.   Control of Dust Mite Allergen Dust mites play a major role in allergic asthma and rhinitis. They occur in environments with high humidity wherever human skin is found. Dust mites absorb humidity from the atmosphere (ie, they do not drink) and feed on organic matter (including shed human and animal skin). Dust mites are a microscopic type of insect that you cannot see with the naked eye. High levels of dust mites have been detected from mattresses, pillows, carpets, upholstered furniture, bed covers, clothes, soft toys and any woven material. The principal allergen of the dust mite is found in its feces. A gram of dust may contain 1,000 mites and 250,000 fecal particles. Mite antigen is easily measured in the air during house cleaning activities. Dust mites do not bite and do not cause harm to humans, other than by triggering allergies/asthma.  Ways to decrease your exposure to dust mites in your home:  1. Encase mattresses, box springs and pillows with a mite-impermeable barrier or cover  2. Wash sheets, blankets and drapes weekly in hot water (130 F) with detergent and dry them in a dryer on the hot setting.  3. Have the room cleaned frequently with a vacuum cleaner and a damp dust-mop. For carpeting or rugs, vacuuming with a vacuum cleaner equipped with a high-efficiency particulate air (HEPA) filter. The dust mite allergic individual should not be in a room which is being cleaned and should wait 1 hour after cleaning before going  into the room.  4. Do not sleep on upholstered furniture (eg, couches).  5. If possible removing carpeting, upholstered furniture and drapery from the home is ideal. Horizontal blinds should be eliminated in the rooms where the person spends the most time (bedroom, study, television room). Washable vinyl, roller-type shades are optimal.  6. Remove all non-washable stuffed toys from the bedroom. Wash stuffed  toys weekly like sheets and blankets above.  7. Reduce indoor humidity to less than 50%. Inexpensive humidity monitors can be purchased at most hardware stores. Do not use a humidifier as can make the problem worse and are not recommended.  Control of Dog or Cat Allergen Avoidance is the best way to manage a dog or cat allergy. If you have a dog or cat and are allergic to dog or cats, consider removing the dog or cat from the home. If you have a dog or cat but don't want to find it a new home, or if your family wants a pet even though someone in the household is allergic, here are some strategies that may help keep symptoms at bay:  Keep the pet out of your bedroom and restrict it to only a few rooms. Be advised that keeping the dog or cat in only one room will not limit the allergens to that room. Don't pet, hug or kiss the dog or cat; if you do, wash your hands with soap and water. High-efficiency particulate air (HEPA) cleaners run continuously in a bedroom or living room can reduce allergen levels over time. Regular use of a high-efficiency vacuum cleaner or a central vacuum can reduce allergen levels. Giving your dog or cat a bath at least once a week can reduce airborne allergen.  Control of Cockroach Allergen Cockroach allergen has been identified as an important cause of acute attacks of asthma, especially in urban settings.  There are fifty-five species of cockroach that exist in the [place] , however only three, the [place], [place] and [place] species produce allergen that can affect patients with Asthma.  Allergens can be obtained from fecal particles, egg casings and secretions from cockroaches.    Remove food sources. Reduce access to water. Seal access and entry points. Spray runways with 0.5-1% Diazinon or Chlorpyrifos Blow boric acid power under stoves and refrigerator. Place bait stations (hydramethylnon) at feeding sites.

## 2023-11-09 ENCOUNTER — Other Ambulatory Visit: Payer: Self-pay | Admitting: Internal Medicine

## 2024-05-07 ENCOUNTER — Ambulatory Visit: Admitting: Internal Medicine
